# Patient Record
Sex: Male | Born: 1960
Health system: Southern US, Community
[De-identification: ages and names within clinical notes are randomized; demographics above are authoritative.]

## PROBLEM LIST (undated history)

## (undated) DIAGNOSIS — E079 Disorder of thyroid, unspecified: Secondary | ICD-10-CM

## (undated) DIAGNOSIS — J45909 Unspecified asthma, uncomplicated: Secondary | ICD-10-CM

## (undated) DIAGNOSIS — E039 Hypothyroidism, unspecified: Secondary | ICD-10-CM

## (undated) DIAGNOSIS — F32A Depression, unspecified: Secondary | ICD-10-CM

## (undated) DIAGNOSIS — I10 Essential (primary) hypertension: Secondary | ICD-10-CM

## (undated) DIAGNOSIS — F329 Major depressive disorder, single episode, unspecified: Secondary | ICD-10-CM

---

## 2013-11-13 ENCOUNTER — Emergency Department (INDEPENDENT_AMBULATORY_CARE_PROVIDER_SITE_OTHER)
Admission: EM | Admit: 2013-11-13 | Discharge: 2013-11-13 | Disposition: A | Discharge status: Home / Self Care | Payer: Self-pay | Source: Home / Self Care

## 2013-11-13 ENCOUNTER — Encounter (HOSPITAL_COMMUNITY): Payer: Self-pay | Admitting: Emergency Medicine

## 2013-11-13 DIAGNOSIS — F32A Depression, unspecified: Secondary | ICD-10-CM

## 2013-11-13 DIAGNOSIS — J069 Acute upper respiratory infection, unspecified: Secondary | ICD-10-CM

## 2013-11-13 DIAGNOSIS — F3289 Other specified depressive episodes: Secondary | ICD-10-CM

## 2013-11-13 DIAGNOSIS — F329 Major depressive disorder, single episode, unspecified: Secondary | ICD-10-CM

## 2013-11-13 DIAGNOSIS — E039 Hypothyroidism, unspecified: Secondary | ICD-10-CM

## 2013-11-13 HISTORY — DX: Disorder of thyroid, unspecified: E07.9

## 2013-11-13 HISTORY — DX: Essential (primary) hypertension: I10

## 2013-11-13 HISTORY — DX: Unspecified asthma, uncomplicated: J45.909

## 2013-11-13 NOTE — ED Provider Notes (Signed)
CSN: [id number]     Arrival date & time 11/13/13  [id number] History   First MD Initiated Contact with Patient 11/13/13 [phone]     Chief Complaint  Patient presents with  . Medication Refill   (Consider location/radiation/quality/duration/timing/severity/associated sxs/prior Treatment) HPI Comments: 53 year old male resident of  [facility] presents with complaints of URI. He is complaining of runny nose, nasal stuffiness, cough, sneezing and sore throat. He denies known fever.  [place] also [place]. He brings with him several prescriptions relating to chronic conditions. He is requesting that they be filled.   Past Medical History  Diagnosis Date  . Thyroid disease   . Hypertension   . Asthma    History reviewed. No pertinent past surgical history. History reviewed. No pertinent family history. History  Substance Use Topics  . Smoking status: Former Games developermoker  . Smokeless tobacco: Not on file  . Alcohol Use: Not on file    Review of Systems  Constitutional: Positive for activity change and fatigue. Negative for fever and diaphoresis.  HENT: Positive for postnasal drip, sneezing and sore throat. Negative for ear pain, facial swelling, rhinorrhea and trouble swallowing.   Eyes: Negative for pain, discharge and redness.  Respiratory: Positive for cough. Negative for chest tightness and shortness of breath.   Cardiovascular: Negative.   Gastrointestinal: Negative.   Musculoskeletal: Negative.  Negative for neck pain and neck stiffness.  Neurological: Negative.     Allergies  Review of patient's allergies indicates no known allergies.  Home Medications   Current Outpatient Rx  Name  Route  Sig  Dispense  Refill  . Albuterol (VENTOLIN IN)   Inhalation   Inhale into the lungs.         Marland Kitchen. amLODipine (NORVASC) 10 MG tablet   Oral   Take 10 mg by mouth daily.         . Levothyroxine Sodium (SYNTHROID PO)   Oral   Take by mouth.         . sertraline  (ZOLOFT) 100 MG tablet   Oral   Take 100 mg by mouth daily.         . TraZODone HCl (DESYREL PO)   Oral   Take by mouth.          BP 157/97  Pulse 64  Temp(Src) 98.7 F (37.1 C) (Oral)  Resp 16  SpO2 99% Physical Exam  Nursing note and vitals reviewed. Constitutional: He is oriented to person, place, and time. He appears well-developed and well-nourished. No distress.  HENT:  Mouth/Throat: Oropharynx is clear and moist. No oropharyngeal exudate.  Bilateral TMs are normal Oropharynx pink no exudates.  Eyes: Conjunctivae and EOM are normal.  Neck: Normal range of motion. Neck supple.  Cardiovascular: Normal rate, regular rhythm and normal heart sounds.   Pulmonary/Chest: Effort normal and breath sounds normal. No respiratory distress. He has no wheezes. He has no rales.  Musculoskeletal: Normal range of motion. He exhibits no edema.  Lymphadenopathy:    He has no cervical adenopathy.  Neurological: He is alert and oriented to person, place, and time.  Skin: Skin is warm and dry. No rash noted.  Psychiatric: He has a normal mood and affect.    ED Course  Procedures (including critical care time) Labs Review Labs Reviewed - No data to display Imaging Review No results found.    MDM   1. URI (upper respiratory infection)   2. Hypothyroidism   3. Depression  For cold symptoms recommend Alka-Seltzer cold plus nighttime reliever and Robitussin-DM. Drink plenty of fluids and stay well hydrated Tylenol and/or ibuprofen as directed for discomfort Pharmacists at [facility] these Rx's should be able to be filled in [place], recommend take them to pharmacist of your choice today.      Hayden Rasmussen, NP 11/13/13 0930  Hayden Rasmussen, NP 11/13/13 [phone]

## 2013-11-13 NOTE — Discharge Instructions (Signed)
Upper Respiratory Infection, Adult °An upper respiratory infection (URI) is also sometimes known as the common cold. The upper respiratory tract includes the nose, sinuses, throat, trachea, and bronchi. Bronchi are the airways leading to the lungs. Most people improve within 1 week, but symptoms can last up to 2 weeks. A residual cough may last even longer.  °CAUSES °Many different viruses can infect the tissues lining the upper respiratory tract. The tissues become irritated and inflamed and often become very moist. Mucus production is also common. A cold is contagious. You can easily spread the virus to others by oral contact. This includes kissing, sharing a glass, coughing, or sneezing. Touching your mouth or nose and then touching a surface, which is then touched by another person, can also spread the virus. °SYMPTOMS  °Symptoms typically develop 1 to 3 days after you come in contact with a cold virus. Symptoms vary from person to person. They may include: °· Runny nose. °· Sneezing. °· Nasal congestion. °· Sinus irritation. °· Sore throat. °· Loss of voice (laryngitis). °· Cough. °· Fatigue. °· Muscle aches. °· Loss of appetite. °· Headache. °· Low-grade fever. °DIAGNOSIS  °You might diagnose your own cold based on familiar symptoms, since most people get a cold 2 to 3 times a year. Your caregiver can confirm this based on your exam. Most importantly, your caregiver can check that your symptoms are not due to another disease such as strep throat, sinusitis, pneumonia, asthma, or epiglottitis. Blood tests, throat tests, and X-rays are not necessary to diagnose a common cold, but they may sometimes be helpful in excluding other more serious diseases. Your caregiver will decide if any further tests are required. °RISKS AND COMPLICATIONS  °You may be at risk for a more severe case of the common cold if you smoke cigarettes, have chronic heart disease (such as heart failure) or lung disease (such as asthma), or if  you have a weakened immune system. The very young and very old are also at risk for more serious infections. Bacterial sinusitis, middle ear infections, and bacterial pneumonia can complicate the common cold. The common cold can worsen asthma and chronic obstructive pulmonary disease (COPD). Sometimes, these complications can require emergency medical care and may be life-threatening. °PREVENTION  °The best way to protect against getting a cold is to practice good hygiene. Avoid oral or hand contact with people with cold symptoms. Wash your hands often if contact occurs. There is no clear evidence that vitamin C, vitamin E, echinacea, or exercise reduces the chance of developing a cold. However, it is always recommended to get plenty of rest and practice good nutrition. °TREATMENT  °Treatment is directed at relieving symptoms. There is no cure. Antibiotics are not effective, because the infection is caused by a virus, not by bacteria. Treatment may include: °· Increased fluid intake. Sports drinks offer valuable electrolytes, sugars, and fluids. °· Breathing heated mist or steam (vaporizer or shower). °· Eating chicken soup or other clear broths, and maintaining good nutrition. °· Getting plenty of rest. °· Using gargles or lozenges for comfort. °· Controlling fevers with ibuprofen or acetaminophen as directed by your caregiver. °· Increasing usage of your inhaler if you have asthma. °Zinc gel and zinc lozenges, taken in the first 24 hours of the common cold, can shorten the duration and lessen the severity of symptoms. Pain medicines may help with fever, muscle aches, and throat pain. A variety of non-prescription medicines are available to treat congestion and runny nose. Your caregiver   can make recommendations and may suggest nasal or lung inhalers for other symptoms.  HOME CARE INSTRUCTIONS   Only take over-the-counter or prescription medicines for pain, discomfort, or fever as directed by your  caregiver.  Use a warm mist humidifier or inhale steam from a shower to increase air moisture. This may keep secretions moist and make it easier to breathe.  Drink enough water and fluids to keep your urine clear or pale yellow.  Rest as needed.  Return to work when your temperature has returned to normal or as your caregiver advises. You may need to stay home longer to avoid infecting others. You can also use a face mask and careful hand washing to prevent spread of the virus. SEEK MEDICAL CARE IF:   After the first few days, you feel you are getting worse rather than better.  You need your caregiver's advice about medicines to control symptoms.  You develop chills, worsening shortness of breath, or brown or red sputum. These may be signs of pneumonia.  You develop yellow or brown nasal discharge or pain in the face, especially when you bend forward. These may be signs of sinusitis.  You develop a fever, swollen neck glands, pain with swallowing, or white areas in the back of your throat. These may be signs of strep throat. SEEK IMMEDIATE MEDICAL CARE IF:   You have a fever.  You develop severe or persistent headache, ear pain, sinus pain, or chest pain.  You develop wheezing, a prolonged cough, cough up blood, or have a change in your usual mucus (if you have chronic lung disease).  You develop sore muscles or a stiff neck. Document Released: 03/24/2001 Document Revised: 12/21/2011 Document Reviewed: 01/30/2011 [facility] Patient Information 2014 [place], [place].  Depression, Adult Depression is feeling sad, low, down in the dumps, blue, gloomy, or empty. In general, there are two kinds of depression:  Normal sadness or grief. This can happen after something upsetting. It often goes away on its own within 2 weeks. After losing a loved one (bereavement), normal sadness and grief may last longer than two weeks. It usually gets better with time.  Clinical depression. This kind lasts  longer than normal sadness or grief. It keeps you from doing the things you normally do in life. It is often hard to function at home, work, or at school. It may affect your relationships with others. Treatment is often needed. GET HELP RIGHT AWAY IF:  You have thoughts about hurting yourself or others.  You lose touch with reality (psychotic symptoms). You may:  See or hear things that are not real.  Have untrue beliefs about your life or people around you.  Your medicine is giving you problems. MAKE SURE YOU:  Understand these instructions.  Will watch your condition.  Will get help right away if you are not doing well or get worse. Document Released: 10/31/2010 Document Revised: 06/22/2012 Document Reviewed: 01/28/2012 [facility] Patient Information 2014 [place], [place].  Hypothyroidism The thyroid is a large gland located in the lower front of your neck. The thyroid gland helps control metabolism. Metabolism is how your body handles food. It controls metabolism with the hormone thyroxine. When this gland is underactive (hypothyroid), it produces too little hormone.  CAUSES These include:   Absence or destruction of thyroid tissue.  Goiter due to iodine deficiency.  Goiter due to medications.  Congenital defects (since birth).  Problems with the pituitary. This causes a lack of TSH (thyroid stimulating hormone). This hormone tells the  thyroid to turn out more hormone. SYMPTOMS  Lethargy (feeling as though you have no energy)  Cold intolerance  Weight gain (in spite of normal food intake)  Dry skin  Coarse hair  Menstrual irregularity (if severe, may lead to infertility)  Slowing of thought processes Cardiac problems are also caused by insufficient amounts of thyroid hormone. Hypothyroidism in the newborn is cretinism, and is an extreme form. It is important that this form be treated adequately and immediately or it will lead rapidly to retarded physical and mental  development. DIAGNOSIS  To prove hypothyroidism, your caregiver may do blood tests and ultrasound tests. Sometimes the signs are hidden. It may be necessary for your caregiver to watch this illness with blood tests either before or after diagnosis and treatment. TREATMENT  Low levels of thyroid hormone are increased by using synthetic thyroid hormone. This is a safe, effective treatment. It usually takes about four weeks to gain the full effects of the medication. After you have the full effect of the medication, it will generally take another four weeks for problems to leave. Your caregiver may start you on low doses. If you have had heart problems the dose may be gradually increased. It is generally not an emergency to get rapidly to normal. HOME CARE INSTRUCTIONS   Take your medications as your caregiver suggests. Let your caregiver know of any medications you are taking or start taking. Your caregiver will help you with dosage schedules.  As your condition improves, your dosage needs may increase. It will be necessary to have continuing blood tests as suggested by your caregiver.  Report all suspected medication side effects to your caregiver. SEEK MEDICAL CARE IF: Seek medical care if you develop:  Sweating.  Tremulousness (tremors).  Anxiety.  Rapid weight loss.  Heat intolerance.  Emotional swings.  Diarrhea.  Weakness. SEEK IMMEDIATE MEDICAL CARE IF:  You develop chest pain, an irregular heart beat (palpitations), or a rapid heart beat. MAKE SURE YOU:   Understand these instructions.  Will watch your condition.  Will get help right away if you are not doing well or get worse. Document Released: 09/28/2005 Document Revised: 12/21/2011 Document Reviewed: 05/18/2008 [facility] Patient Information 2014 [place], [place].

## 2013-11-13 NOTE — ED Notes (Signed)
Pt  Has  rx  With  Him  From  [place]  For  His  maintance meds      They appear  Legitimate   [facility]  [facility]   And    She  Stated  They  Should  Be  Valid  At  A   [facility]

## 2013-11-13 NOTE — ED Notes (Signed)
Pt  [facility]  [facility]   He  Is  Here  [place] [facility]   Symptoms  Of a  [place] PeersUri  As  Well  As  To  Get  His  meds  Refilled  He  Is  In no  Distress

## 2013-11-16 NOTE — ED Provider Notes (Signed)
Medical screening examination/treatment/procedure(s) were performed by a resident physician or non-physician practitioner and as the supervising physician I was immediately available for consultation/collaboration.  Evan Corey, MD    Evan S Corey, MD 11/16/13 0745 

## 2014-02-09 ENCOUNTER — Emergency Department (INDEPENDENT_AMBULATORY_CARE_PROVIDER_SITE_OTHER): Payer: Self-pay

## 2014-02-09 ENCOUNTER — Emergency Department (INDEPENDENT_AMBULATORY_CARE_PROVIDER_SITE_OTHER)
Admission: EM | Admit: 2014-02-09 | Discharge: 2014-02-09 | Disposition: A | Discharge status: Home / Self Care | Payer: Self-pay | Source: Home / Self Care | Attending: Family Medicine | Admitting: Family Medicine

## 2014-02-09 ENCOUNTER — Encounter (HOSPITAL_COMMUNITY): Payer: Self-pay | Admitting: Emergency Medicine

## 2014-02-09 DIAGNOSIS — S92911A Unspecified fracture of right toe(s), initial encounter for closed fracture: Secondary | ICD-10-CM

## 2014-02-09 DIAGNOSIS — S92919A Unspecified fracture of unspecified toe(s), initial encounter for closed fracture: Secondary | ICD-10-CM

## 2014-02-09 DIAGNOSIS — I10 Essential (primary) hypertension: Secondary | ICD-10-CM

## 2014-02-09 DIAGNOSIS — IMO0002 Reserved for concepts with insufficient information to code with codable children: Secondary | ICD-10-CM

## 2014-02-09 LAB — POCT I-STAT, CHEM 8
BUN: 9 mg/dL (ref 6–23)
CALCIUM ION: 1.21 mmol/L (ref 1.12–1.23)
CHLORIDE: 104 meq/L (ref 96–112)
CREATININE: 1.1 mg/dL (ref 0.50–1.35)
GLUCOSE: 90 mg/dL (ref 70–99)
HCT: 48 % (ref 39.0–52.0)
Hemoglobin: 16.3 g/dL (ref 13.0–17.0)
Potassium: 3.5 mEq/L — ABNORMAL LOW (ref 3.7–5.3)
Sodium: 145 mEq/L (ref 137–147)
TCO2: 25 mmol/L (ref 0–100)

## 2014-02-09 MED ORDER — AMLODIPINE BESYLATE 10 MG PO TABS: 10.0000 mg | ORAL_TABLET | Freq: Every day | ORAL | Status: DC

## 2014-02-09 MED ORDER — LISINOPRIL 20 MG PO TABS: 20.0000 mg | ORAL_TABLET | Freq: Every day | ORAL | Status: DC

## 2014-02-09 NOTE — ED Provider Notes (Signed)
Herma Meringommy Brodersen is a 53 y.o. male who presents to Urgent Care today for right toe. Patient notes right toe pain. The pain is located in the right great toe. He dropped a heavy object onto it about a week ago. The pain persists and is located in the proximal phalanx. Pain is worse with activity better with rest. No medications yet. Additionally he notes he stopped taking his blood pressure medication over the past several months. No chest pains palpitations or shortness of breath.   Past Medical History  Diagnosis Date  . Thyroid disease   . Hypertension   . Asthma    History  Substance Use Topics  . Smoking status: Never Smoker   . Smokeless tobacco: Not on file  . Alcohol Use: Not on file   ROS as above Medications: No current facility-administered medications for this encounter.   Current Outpatient Prescriptions  Medication Sig Dispense Refill  . Albuterol (VENTOLIN IN) Inhale into the lungs.      Marland Kitchen. amLODipine (NORVASC) 10 MG tablet Take 1 tablet (10 mg total) by mouth daily.  30 tablet  0  . Levothyroxine Sodium (SYNTHROID PO) Take by mouth.      Marland Kitchen. lisinopril (PRINIVIL,ZESTRIL) 20 MG tablet Take 1 tablet (20 mg total) by mouth daily.  30 tablet  0  . sertraline (ZOLOFT) 100 MG tablet Take 100 mg by mouth daily.      . TraZODone HCl (DESYREL PO) Take by mouth.        Exam:  BP 180/102  Pulse 56  Temp(Src) 98.2 F (36.8 C) (Oral)  Resp 18  SpO2 100% Gen: Well NAD HEENT: EOMI,  MMM Lungs: Normal work of breathing. CTABL Heart: RRR no MRG Abd: NABS, Soft. NT, ND Exts: Brisk capillary refill, warm and well perfused.  Right great toe: Swollen and tender at the distal phalanx. Capillary refill and sensation intact  Results for orders placed during the hospital encounter of 02/09/14 (from the past 24 hour(s))  POCT I-STAT, CHEM 8     Status: Abnormal   Collection Time    02/09/14  1:16 PM      Result Value Ref Range   Sodium 145  137 - 147 mEq/L   Potassium 3.5 (*) 3.7 -  5.3 mEq/L   Chloride 104  96 - 112 mEq/L   BUN 9  6 - 23 mg/dL   Creatinine, Ser 9.601.10  0.50 - 1.35 mg/dL   Glucose, Bld 90  70 - 99 mg/dL   Calcium, Ion 4.541.21  0.981.12 - 1.23 mmol/L   TCO2 25  0 - 100 mmol/L   Hemoglobin 16.3  13.0 - 17.0 g/dL   HCT 11.948.0  14.739.0 - 82.952.0 %   Dg Foot Complete Right  02/09/2014   CLINICAL DATA:  Injury.  EXAM: RIGHT FOOT COMPLETE - 3+ VIEW  COMPARISON:  None.  FINDINGS: Nondisplaced fracture at the base of the distal phalanx of the right great toe. Distal tuft fracture also noted of the distal phalanx. Fracture fragments of the distal tuft fracture are displaced. DJD first MTP joint. No foreign body.  IMPRESSION: 1. Fractures involving the distal phalanx of the right great toe. 2. DJD first MTP joint.   Electronically Signed   By: Maisie Fushomas  Register   On: 02/09/2014 13:35    Assessment and Plan: 53 y.o. male with right great toe fracture. Plan for postoperative shoe and watchful waiting. Additionally patient has poorly controlled hypertension. We'll prescribe of amlodipine and lisinopril. Followup with  primary care provider soon as possible.  Discussed warning signs or symptoms. Please see discharge instructions. Patient expresses understanding.    Rodolph BongEvan S Corey, MD 02/09/14 1340

## 2014-02-09 NOTE — Discharge Instructions (Signed)
Thank you for coming in today. Use the post op shoe.  Take both amlodipine and lisinopril for blood pressure.  Follow up with a regular doctor.  Toe Fracture Your caregiver has diagnosed you as having a fractured toe. A toe fracture is a break in the bone of a toe. "Buddy taping" is a way of splinting your broken toe, by taping the broken toe to the toe next to it. This "buddy taping" will keep the injured toe from moving beyond normal range of motion. Buddy taping also helps the toe heal in a more normal alignment. It may take 6 to 8 weeks for the toe injury to heal. HOME CARE INSTRUCTIONS   Leave your toes taped together for as long as directed by your caregiver or until you see a doctor for a follow-up examination. You can change the tape after bathing. Always use a small piece of gauze or cotton between the toes when taping them together. This will help the skin stay dry and prevent infection.  Apply ice to the injury for 15-20 minutes each hour while awake for the first 2 days. Put the ice in a plastic bag and place a towel between the bag of ice and your skin.  After the first 2 days, apply heat to the injured area. Use heat for the next 2 to 3 days. Place a heating pad on the foot or soak the foot in warm water as directed by your caregiver.  Keep your foot elevated as much as possible to lessen swelling.  Wear sturdy, supportive shoes. The shoes should not pinch the toes or fit tightly against the toes.  Your caregiver may prescribe a rigid shoe if your foot is very swollen.  Your may be given crutches if the pain is too great and it hurts too much to walk.  Only take over-the-counter or prescription medicines for pain, discomfort, or fever as directed by your caregiver.  If your caregiver has given you a follow-up appointment, it is very important to keep that appointment. Not keeping the appointment could result in a chronic or permanent injury, pain, and disability. If there is any  problem keeping the appointment, you must call back to this facility for assistance. SEEK MEDICAL CARE IF:   You have increased pain or swelling, not relieved with medications.  The pain does not get better after 1 week.  Your injured toe is cold when the others are warm. SEEK IMMEDIATE MEDICAL CARE IF:   The toe becomes cold, numb, or white.  The toe becomes hot (inflamed) and red. Document Released: 09/25/2000 Document Revised: 12/21/2011 Document Reviewed: 05/14/2008 [facility] Patient Information 2014 [place], [place].   Arterial Hypertension Arterial hypertension (high blood pressure) is a condition of elevated pressure in your blood vessels. Hypertension over a long period of time is a risk factor for strokes, heart attacks, and heart failure. It is also the leading cause of kidney (renal) failure.  CAUSES   In Adults -- Over 90% of all hypertension has no known cause. This is called essential or primary hypertension. In the other 10% of people with hypertension, the increase in blood pressure is caused by another disorder. This is called secondary hypertension. Important causes of secondary hypertension are:  Heavy alcohol use.  Obstructive sleep apnea.  Hyperaldosterosim (Conn's syndrome).  Steroid use.  Chronic kidney failure.  Hyperparathyroidism.  Medications.  Renal artery stenosis.  Pheochromocytoma.  Cushing's disease.  Coarctation of the aorta.  Scleroderma renal crisis.  Licorice (in excessive  amounts).  Drugs (cocaine, methamphetamine). Your caregiver can explain any items above that apply to you.  In Children -- Secondary hypertension is more common and should always be considered.  Pregnancy -- Few women of childbearing age have high blood pressure. However, up to 10% of them develop hypertension of pregnancy. Generally, this will not harm the woman. It may be a sign of 3 complications of pregnancy: preeclampsia, HELLP syndrome, and eclampsia.  Follow up and control with medication is necessary. SYMPTOMS   This condition normally does not produce any noticeable symptoms. It is usually found during a routine exam.  Malignant hypertension is a late problem of high blood pressure. It may have the following symptoms:  Headaches.  Blurred vision.  End-organ damage (this means your kidneys, heart, lungs, and other organs are being damaged).  Stressful situations can increase the blood pressure. If a person with normal blood pressure has their blood pressure go up while being seen by their caregiver, this is often termed "white coat hypertension." Its importance is not known. It may be related with eventually developing hypertension or complications of hypertension.  Hypertension is often confused with mental tension, stress, and anxiety. DIAGNOSIS  The diagnosis is made by 3 separate blood pressure measurements. They are taken at least 1 week apart from each other. If there is organ damage from hypertension, the diagnosis may be made without repeat measurements. Hypertension is usually identified by having blood pressure readings:  Above 140/90 mmHg measured in both arms, at 3 separate times, over a couple weeks.  Over 130/80 mmHg should be considered a risk factor and may require treatment in patients with diabetes. Blood pressure readings over 120/80 mmHg are called "pre-hypertension" even in non-diabetic patients. To get a true blood pressure measurement, use the following guidelines. Be aware of the factors that can alter blood pressure readings.  Take measurements at least 1 hour after caffeine.  Take measurements 30 minutes after smoking and without any stress. This is another reason to quit smoking  it raises your blood pressure.  Use a proper cuff size. Ask your caregiver if you are not sure about your cuff size.  Most home blood pressure cuffs are automatic. They will measure systolic and diastolic pressures. The systolic  pressure is the pressure reading at the start of sounds. Diastolic pressure is the pressure at which the sounds disappear. If you are elderly, measure pressures in multiple postures. Try sitting, lying or standing.  Sit at rest for a minimum of 5 minutes before taking measurements.  You should not be on any medications like decongestants. These are found in many cold medications.  Record your blood pressure readings and review them with your caregiver. If you have hypertension:  Your caregiver may do tests to be sure you do not have secondary hypertension (see "causes" above).  Your caregiver may also look for signs of metabolic syndrome. This is also called Syndrome X or Insulin Resistance Syndrome. You may have this syndrome if you have type 2 diabetes, abdominal obesity, and abnormal blood lipids in addition to hypertension.  Your caregiver will take your medical and family history and perform a physical exam.  Diagnostic tests may include blood tests (for glucose, cholesterol, potassium, and kidney function), a urinalysis, or an EKG. Other tests may also be necessary depending on your condition. PREVENTION  There are important lifestyle issues that you can adopt to reduce your chance of developing hypertension:  Maintain a normal weight.  Limit the amount of salt (  sodium) in your diet.  Exercise often.  Limit alcohol intake.  Get enough potassium in your diet. Discuss specific advice with your caregiver.  Follow a DASH diet (dietary approaches to stop hypertension). This diet is rich in fruits, vegetables, and low-fat dairy products, and avoids certain fats. PROGNOSIS  Essential hypertension cannot be cured. Lifestyle changes and medical treatment can lower blood pressure and reduce complications. The prognosis of secondary hypertension depends on the underlying cause. Many people whose hypertension is controlled with medicine or lifestyle changes can live a normal, healthy life.    RISKS AND COMPLICATIONS  While high blood pressure alone is not an illness, it often requires treatment due to its short- and long-term effects on many organs. Hypertension increases your risk for:  CVAs or strokes (cerebrovascular accident).  Heart failure due to chronically high blood pressure (hypertensive cardiomyopathy).  Heart attack (myocardial infarction).  Damage to the retina (hypertensive retinopathy).  Kidney failure (hypertensive nephropathy). Your caregiver can explain list items above that apply to you. Treatment of hypertension can significantly reduce the risk of complications. TREATMENT   For overweight patients, weight loss and regular exercise are recommended. Physical fitness lowers blood pressure.  Mild hypertension is usually treated with diet and exercise. A diet rich in fruits and vegetables, fat-free dairy products, and foods low in fat and salt (sodium) can help lower blood pressure. Decreasing salt intake decreases blood pressure in a 1/3 of people.  Stop smoking if you are a smoker. The steps above are highly effective in reducing blood pressure. While these actions are easy to suggest, they are difficult to achieve. Most patients with moderate or severe hypertension end up requiring medications to bring their blood pressure down to a normal level. There are several classes of medications for treatment. Blood pressure pills (antihypertensives) will lower blood pressure by their different actions. Lowering the blood pressure by 10 mmHg may decrease the risk of complications by as much as 25%. The goal of treatment is effective blood pressure control. This will reduce your risk for complications. Your caregiver will help you determine the best treatment for you according to your lifestyle. What is excellent treatment for one person, may not be for you. HOME CARE INSTRUCTIONS   Do not smoke.  Follow the lifestyle changes outlined in the "Prevention"  section.  If you are on medications, follow the directions carefully. Blood pressure medications must be taken as prescribed. Skipping doses reduces their benefit. It also puts you at risk for problems.  Follow up with your caregiver, as directed.  If you are asked to monitor your blood pressure at home, follow the guidelines in the "Diagnosis" section above. SEEK MEDICAL CARE IF:   You think you are having medication side effects.  You have recurrent headaches or lightheadedness.  You have swelling in your ankles.  You have trouble with your vision. SEEK IMMEDIATE MEDICAL CARE IF:   You have sudden onset of chest pain or pressure, difficulty breathing, or other symptoms of a heart attack.  You have a severe headache.  You have symptoms of a stroke (such as sudden weakness, difficulty speaking, difficulty walking). MAKE SURE YOU:   Understand these instructions.  Will watch your condition.  Will get help right away if you are not doing well or get worse. Document Released: 09/28/2005 Document Revised: 12/21/2011 Document Reviewed: 04/28/2007 [facility] Patient Information 2014 [place].

## 2014-02-09 NOTE — ED Notes (Signed)
Pt reports inj to right greater toe onset 2 weeks Reports he dropped a flower pot on toe  Sx include swelling and pain Ambulated well to exam room w/NAD Alert w/no signs of acute distress.

## 2014-03-24 ENCOUNTER — Emergency Department (INDEPENDENT_AMBULATORY_CARE_PROVIDER_SITE_OTHER)
Admission: EM | Admit: 2014-03-24 | Discharge: 2014-03-24 | Disposition: A | Discharge status: Home / Self Care | Payer: Self-pay | Source: Home / Self Care | Attending: Emergency Medicine | Admitting: Emergency Medicine

## 2014-03-24 ENCOUNTER — Encounter (HOSPITAL_COMMUNITY): Payer: Self-pay | Admitting: Emergency Medicine

## 2014-03-24 DIAGNOSIS — I10 Essential (primary) hypertension: Secondary | ICD-10-CM

## 2014-03-24 MED ORDER — LISINOPRIL 20 MG PO TABS: 20.0000 mg | ORAL_TABLET | Freq: Every day | ORAL | Status: DC

## 2014-03-24 MED ORDER — AMLODIPINE BESYLATE 10 MG PO TABS: 10.0000 mg | ORAL_TABLET | Freq: Every day | ORAL | Status: DC

## 2014-03-24 NOTE — Discharge Instructions (Signed)

## 2014-03-24 NOTE — ED Provider Notes (Signed)
CSN: [id number]     Arrival date & time 03/24/14  1849 History   First MD Initiated Contact with Patient 03/24/14 1920     Chief Complaint  Patient presents with  . Medication Refill   (Consider location/radiation/quality/duration/timing/severity/associated sxs/prior Treatment) HPI Comments: Patient presents requesting refills of his medications and also requesting to have his toenails cut. He ran out of his high blood pressure medicine today. Denies chest pain, shortness of breath, leg swelling. Also he says his toenails are long and he doesn't own a toenail clipper and would like me to cut them. He is not diabetic   Past Medical History  Diagnosis Date  . Thyroid disease   . Hypertension   . Asthma    History reviewed. No pertinent past surgical history. No family history on file. History  Substance Use Topics  . Smoking status: Never Smoker   . Smokeless tobacco: Not on file  . Alcohol Use: No    Review of Systems  Constitutional: Negative for fever, chills and fatigue.  HENT: Negative for sore throat.   Eyes: Negative for visual disturbance.  Respiratory: Negative for cough and shortness of breath.   Cardiovascular: Negative for chest pain, palpitations and leg swelling.  Gastrointestinal: Negative for nausea, vomiting, abdominal pain, diarrhea and constipation.  Genitourinary: Negative for dysuria, urgency, frequency and hematuria.  Musculoskeletal: Negative for arthralgias, myalgias, neck pain and neck stiffness.  Skin: Negative for rash.       Long toenails  Neurological: Negative for dizziness, weakness and light-headedness.  All other systems reviewed and are negative.   Allergies  Review of patient's allergies indicates no known allergies.  Home Medications   Prior to Admission medications   Medication Sig Start Date End Date Taking? Authorizing Provider  Albuterol (VENTOLIN IN) Inhale into the lungs.    Historical Provider, MD  amLODipine (NORVASC) 10 MG  tablet Take 1 tablet (10 mg total) by mouth daily. 03/24/14   Graylon GoodZachary H Shelley Pooley, PA-C  Levothyroxine Sodium (SYNTHROID PO) Take by mouth.    Historical Provider, MD  lisinopril (PRINIVIL,ZESTRIL) 20 MG tablet Take 1 tablet (20 mg total) by mouth daily. 03/24/14   Graylon GoodZachary H Parvin Stetzer, PA-C  sertraline (ZOLOFT) 100 MG tablet Take 100 mg by mouth daily.    Historical Provider, MD  TraZODone HCl (DESYREL PO) Take by mouth.    Historical Provider, MD   There were no vitals taken for this visit. Physical Exam  Nursing note and vitals reviewed. Constitutional: He is oriented to person, place, and time. He appears well-developed and well-nourished. No distress.  HENT:  Head: Normocephalic.  Cardiovascular: Normal rate, regular rhythm, normal heart sounds and intact distal pulses.  Exam reveals no gallop and no friction rub.   No murmur heard. Pulmonary/Chest: Effort normal and breath sounds normal. No respiratory distress. He has no wheezes. He has no rales.  Neurological: He is alert and oriented to person, place, and time. Coordination normal.  Skin: Skin is warm and dry. No rash noted. He is not diaphoretic.  Psychiatric: He has a normal mood and affect. Judgment normal.    ED Course  Procedures (including critical care time) Labs Review Labs Reviewed - No data to display  Imaging Review No results found.   MDM   1. Hypertension    Blood pressure medications refilled. He will go purchase a toenail clipper, and will followup with primary care.   Meds ordered this encounter  Medications  . lisinopril (PRINIVIL,ZESTRIL) 20 MG tablet  Sig: Take 1 tablet (20 mg total) by mouth daily.    Dispense:  30 tablet    Refill:  0    Order Specific Question:  Supervising Provider    Answer:  Lorenz CoasterKELLER, DAVID C [id number]]  . amLODipine (NORVASC) 10 MG tablet    Sig: Take 1 tablet (10 mg total) by mouth daily.    Dispense:  30 tablet    Refill:  0    Order Specific Question:  Supervising Provider     Answer:  Lorenz CoasterKELLER, DAVID C [[id number]]       Graylon GoodZachary H Sanayah Munro, PA-C 03/24/14 2021

## 2014-03-24 NOTE — ED Provider Notes (Signed)
Medical screening examination/treatment/procedure(s) were performed by non-physician practitioner and as supervising physician I was immediately available for consultation/collaboration.  Leslee Homeavid Hiilei Gerst, M.D.  Reuben Likesavid C Payson Crumby, MD 03/24/14 [phone]

## 2014-03-24 NOTE — ED Notes (Signed)
Pt is here needing refills on HTN meds Also needing toe nails clipped???  [facility] not own a toe nail clipper[facility] Alert w/no signs of acute distress.

## 2014-08-06 ENCOUNTER — Emergency Department (INDEPENDENT_AMBULATORY_CARE_PROVIDER_SITE_OTHER)
Admission: EM | Admit: 2014-08-06 | Discharge: 2014-08-06 | Disposition: A | Discharge status: Home / Self Care | Payer: Self-pay | Source: Home / Self Care

## 2014-08-06 ENCOUNTER — Encounter (HOSPITAL_COMMUNITY): Payer: Self-pay | Admitting: Emergency Medicine

## 2014-08-06 DIAGNOSIS — I10 Essential (primary) hypertension: Secondary | ICD-10-CM

## 2014-08-06 MED ORDER — LISINOPRIL 20 MG PO TABS: 20.0000 mg | ORAL_TABLET | Freq: Every day | ORAL | Status: DC

## 2014-08-06 MED ORDER — AMLODIPINE BESYLATE 10 MG PO TABS: 10.0000 mg | ORAL_TABLET | Freq: Every day | ORAL | Status: DC

## 2014-08-06 NOTE — ED Notes (Signed)
Pt is here today because he has a history of increased BP, pt said that he has had high BP for years and ran out of his meds about 1 month ago, pt started to experience head aches and came in today for BP medication

## 2014-08-06 NOTE — ED Provider Notes (Signed)
CSN: [id number]     Arrival date & time 08/06/14  1746 History   First MD Initiated Contact with Patient 08/06/14 1850     Chief Complaint  Patient presents with  . Hypertension   (Consider location/radiation/quality/duration/timing/severity/associated sxs/prior Treatment) HPI Comments: 53 year old male resident [facility] presents to the urgent care for refill of his antihypertensives. He has obtained his antihypertensives from the urgent care in the past. He has been instructed to follow-up with the PCP however he has not, possibly due to inadequate resources. He is complaining of headache and occasional dizziness since stopping his medications approximately 3 weeks ago.   Past Medical History  Diagnosis Date  . Thyroid disease   . Hypertension   . Asthma    History reviewed. No pertinent past surgical history. History reviewed. No pertinent family history. History  Substance Use Topics  . Smoking status: Never Smoker   . Smokeless tobacco: Not on file  . Alcohol Use: No    Review of Systems  Constitutional: Negative.   Respiratory: Negative for cough and shortness of breath.   Cardiovascular: Negative for chest pain, palpitations and leg swelling.  Gastrointestinal: Negative for nausea, vomiting and abdominal pain.  Skin: Negative.   Neurological: Positive for dizziness and headaches. Negative for tremors, seizures, syncope and speech difficulty.  Psychiatric/Behavioral: Negative.     Allergies  Review of patient's allergies indicates no known allergies.  Home Medications   Prior to Admission medications   Medication Sig Start Date End Date Taking? Authorizing Provider  Albuterol (VENTOLIN IN) Inhale into the lungs.    Historical Provider, MD  amLODipine (NORVASC) 10 MG tablet Take 1 tablet (10 mg total) by mouth daily. 03/24/14   Adrian BlackwaterZachary H Baker, PA-C  amLODipine (NORVASC) 10 MG tablet Take 1 tablet (10 mg total) by mouth daily. 08/06/14   Hayden Rasmussenavid Jarriel Papillion, NP   Levothyroxine Sodium (SYNTHROID PO) Take by mouth.    Historical Provider, MD  lisinopril (PRINIVIL,ZESTRIL) 20 MG tablet Take 1 tablet (20 mg total) by mouth daily. 03/24/14   Adrian BlackwaterZachary H Baker, PA-C  lisinopril (PRINIVIL,ZESTRIL) 20 MG tablet Take 1 tablet (20 mg total) by mouth daily. 08/06/14   Hayden Rasmussenavid Ozias Dicenzo, NP  sertraline (ZOLOFT) 100 MG tablet Take 100 mg by mouth daily.    Historical Provider, MD  TraZODone HCl (DESYREL PO) Take by mouth.    Historical Provider, MD   BP 171/103  Pulse 66  Temp(Src) 98.3 F (36.8 C) (Oral)  Resp 16  SpO2 98% Physical Exam  Nursing note and vitals reviewed. Constitutional: He is oriented to person, place, and time. He appears well-developed and well-nourished. No distress.  Eyes: Conjunctivae and EOM are normal.  Neck: Normal range of motion. Neck supple.  Cardiovascular: Normal rate, regular rhythm and normal heart sounds.   Pulmonary/Chest: Effort normal and breath sounds normal. No respiratory distress. He has no wheezes. He has no rales.  Musculoskeletal: Normal range of motion. He exhibits no edema and no tenderness.  Neurological: He is alert and oriented to person, place, and time. He exhibits normal muscle tone.  Skin: Skin is warm and dry. No rash noted.  Psychiatric: He has a normal mood and affect.    ED Course  Procedures (including critical care time) Labs Review Labs Reviewed - No data to display  Imaging Review No results found.   MDM   1. Essential hypertension     Refill amlodipine 10 mg 1 daily #30 and lisinopril 20 mg 1 daily #30. It  was reinforced that the patient needs to have adequate management and evaluation for his hypertensive. Prescribing medications without upper evaluation could lead to adverse effects of the medication as well as failure to produce an adequate blood pressure response. Patient states he understands. He will be given the telephone number of the operator as well as the community wellness program.  He is also advised that he might return to the urgent care during the day [facility] an orange card.    Hayden Rasmussenavid Shireen Rayburn, NP 08/06/14 1907

## 2014-08-06 NOTE — Discharge Instructions (Signed)
Hypertension Hypertension is another name for high blood pressure. High blood pressure forces your heart to work harder to pump blood. A blood pressure reading has two numbers, which includes a higher number over a lower number (example: 110/72). HOME CARE   Have your blood pressure rechecked by your doctor.  Only take medicine as told by your doctor. Follow the directions carefully. The medicine does not work as well if you skip doses. Skipping doses also puts you at risk for problems.  Do not smoke.  Monitor your blood pressure at home as told by your doctor. GET HELP IF:  You think you are having a reaction to the medicine you are taking.  You have repeat headaches or feel dizzy.  You have puffiness (swelling) in your ankles.  You have trouble with your vision. GET HELP RIGHT AWAY IF:   You get a very bad headache and are confused.  You feel weak, numb, or faint.  You get chest or belly (abdominal) pain.  You throw up (vomit).  You cannot breathe very well. MAKE SURE YOU:   Understand these instructions.  Will watch your condition.  Will get help right away if you are not doing well or get worse. Document Released: 03/16/2008 Document Revised: 10/03/2013 Document Reviewed: 07/21/2013 [employer] Patient Information 2015 [employer]. This information is not intended to replace advice given to you by your health care provider. Make sure you discuss any questions you have with your health care provider.  

## 2014-08-07 NOTE — ED Provider Notes (Signed)
Medical screening examination/treatment/procedure(s) were performed by resident physician or non-physician practitioner and as supervising physician I was immediately available for consultation/collaboration.   Sumner Kirchman DOUGLAS MD.   Donnis Pecha D Bon Dowis, MD 08/07/14 1013 

## 2014-10-16 ENCOUNTER — Emergency Department (HOSPITAL_COMMUNITY)
Admission: EM | Admit: 2014-10-16 | Discharge: 2014-10-16 | Disposition: A | Discharge status: Home / Self Care | Payer: Self-pay | Attending: Emergency Medicine | Admitting: Emergency Medicine

## 2014-10-16 ENCOUNTER — Emergency Department (HOSPITAL_COMMUNITY): Payer: Self-pay

## 2014-10-16 ENCOUNTER — Encounter (HOSPITAL_COMMUNITY): Payer: Self-pay | Admitting: Emergency Medicine

## 2014-10-16 DIAGNOSIS — Z79899 Other long term (current) drug therapy: Secondary | ICD-10-CM | POA: Insufficient documentation

## 2014-10-16 DIAGNOSIS — R51 Headache: Secondary | ICD-10-CM | POA: Insufficient documentation

## 2014-10-16 DIAGNOSIS — E079 Disorder of thyroid, unspecified: Secondary | ICD-10-CM | POA: Insufficient documentation

## 2014-10-16 DIAGNOSIS — I160 Hypertensive urgency: Secondary | ICD-10-CM

## 2014-10-16 DIAGNOSIS — I10 Essential (primary) hypertension: Secondary | ICD-10-CM | POA: Insufficient documentation

## 2014-10-16 DIAGNOSIS — J45909 Unspecified asthma, uncomplicated: Secondary | ICD-10-CM | POA: Insufficient documentation

## 2014-10-16 LAB — CBC WITH DIFFERENTIAL/PLATELET
Basophils Absolute: 0 10*3/uL (ref 0.0–0.1)
Basophils Relative: 0 % (ref 0–1)
EOS PCT: 2 % (ref 0–5)
Eosinophils Absolute: 0.1 10*3/uL (ref 0.0–0.7)
HCT: 38.6 % — ABNORMAL LOW (ref 39.0–52.0)
Hemoglobin: 12.7 g/dL — ABNORMAL LOW (ref 13.0–17.0)
LYMPHS PCT: 34 % (ref 12–46)
Lymphs Abs: 1.8 10*3/uL (ref 0.7–4.0)
MCH: 27.7 pg (ref 26.0–34.0)
MCHC: 32.9 g/dL (ref 30.0–36.0)
MCV: 84.1 fL (ref 78.0–100.0)
Monocytes Absolute: 0.4 10*3/uL (ref 0.1–1.0)
Monocytes Relative: 8 % (ref 3–12)
NEUTROS ABS: 2.9 10*3/uL (ref 1.7–7.7)
Neutrophils Relative %: 56 % (ref 43–77)
PLATELETS: 123 10*3/uL — AB (ref 150–400)
RBC: 4.59 MIL/uL (ref 4.22–5.81)
RDW: 15.1 % (ref 11.5–15.5)
WBC: 5.4 10*3/uL (ref 4.0–10.5)

## 2014-10-16 LAB — BASIC METABOLIC PANEL
Anion gap: 5 (ref 5–15)
BUN: 14 mg/dL (ref 6–23)
CO2: 26 mmol/L (ref 19–32)
CREATININE: 1.2 mg/dL (ref 0.50–1.35)
Calcium: 8.8 mg/dL (ref 8.4–10.5)
Chloride: 109 mEq/L (ref 96–112)
GFR, EST AFRICAN AMERICAN: 77 mL/min — AB (ref >90)
GFR, EST NON AFRICAN AMERICAN: 66 mL/min — AB (ref >90)
GLUCOSE: 81 mg/dL (ref 70–99)
Potassium: 3.8 mmol/L (ref 3.5–5.1)
Sodium: 140 mmol/L (ref 135–145)

## 2014-10-16 LAB — URINALYSIS, ROUTINE W REFLEX MICROSCOPIC
Bilirubin Urine: NEGATIVE
Glucose, UA: NEGATIVE mg/dL
Hgb urine dipstick: NEGATIVE
KETONES UR: NEGATIVE mg/dL
Leukocytes, UA: NEGATIVE
NITRITE: NEGATIVE
PH: 7 (ref 5.0–8.0)
Protein, ur: NEGATIVE mg/dL
Specific Gravity, Urine: 1.021 (ref 1.005–1.030)
Urobilinogen, UA: 1 mg/dL (ref 0.0–1.0)

## 2014-10-16 LAB — I-STAT TROPONIN, ED: Troponin i, poc: 0 ng/mL (ref 0.00–0.08)

## 2014-10-16 MED ORDER — AMLODIPINE BESYLATE 10 MG PO TABS: 10.0000 mg | ORAL_TABLET | Freq: Every day | ORAL | Status: DC

## 2014-10-16 MED ORDER — LISINOPRIL 20 MG PO TABS: 20.0000 mg | ORAL_TABLET | Freq: Every day | ORAL | Status: DC

## 2014-10-16 MED ORDER — LISINOPRIL 20 MG PO TABS
20.0000 mg | ORAL_TABLET | Freq: Once | ORAL | Status: AC
  Administered 2014-10-16: 20 mg via ORAL
  Filled 2014-10-16: qty 1

## 2014-10-16 MED ORDER — AMLODIPINE BESYLATE 5 MG PO TABS
10.0000 mg | ORAL_TABLET | Freq: Once | ORAL | Status: AC
  Administered 2014-10-16: 10 mg via ORAL
  Filled 2014-10-16: qty 2

## 2014-10-16 NOTE — ED Notes (Addendum)
Pt reports htn due to being out of amlodipine and lisinopril due to financial reasons.  Pt also reports sob x2 weeks.  Pt alert and oriented and on room air 100% 02. Pt also reports midsternal cp x3days that "comes and goes."  Pt denies n/v/d, sweating, radiation.  Pt denies cp at this time.

## 2014-10-16 NOTE — Discharge Instructions (Signed)
It is very important to take all medication as directed, and be sure to follow-up with a primary care physician for appropriate ongoing evaluation of your conditions.

## 2014-10-16 NOTE — ED Provider Notes (Signed)
CSN: [id number]     Arrival date & time 10/16/14  1804 History   First MD Initiated Contact with Patient 10/16/14 2029     Chief Complaint  Patient presents with  . Cough  . Hypertension     HPI  Patient resents with concern of ongoing hypertension, as well as new cough, mild headache. Symptoms all began approximately 2 weeks ago, when the patient ran out of medication. Patient denies sustained or substantial chest pain, pressure, or any dyspnea. He also denies any nausea, vomiting, diarrhea. The cough is mild, dry, with associated rhinorrhea.   Past Medical History  Diagnosis Date  . Thyroid disease   . Hypertension   . Asthma    History reviewed. No pertinent past surgical history. No family history on file. History  Substance Use Topics  . Smoking status: Never Smoker   . Smokeless tobacco: Not on file  . Alcohol Use: No    Review of Systems  Constitutional:       Per HPI, otherwise negative  HENT:       Per HPI, otherwise negative  Respiratory:       Per HPI, otherwise negative  Cardiovascular:       Per HPI, otherwise negative  Gastrointestinal: Negative for vomiting.  Endocrine:       Negative aside from HPI  Genitourinary:       Neg aside from HPI   Musculoskeletal:       Per HPI, otherwise negative  Skin: Negative.   Neurological: Negative for syncope.      Allergies  Review of patient's allergies indicates no known allergies.  Home Medications   Prior to Admission medications   Medication Sig Start Date End Date Taking? Authorizing Provider  Albuterol (VENTOLIN IN) Inhale into the lungs.    Historical Provider, MD  amLODipine (NORVASC) 10 MG tablet Take 1 tablet (10 mg total) by mouth daily. 03/24/14   Adrian Blackwater Baker, PA-C  amLODipine (NORVASC) 10 MG tablet Take 1 tablet (10 mg total) by mouth daily. 08/06/14   Hayden Rasmussen, NP  Levothyroxine Sodium (SYNTHROID PO) Take by mouth.    Historical Provider, MD  lisinopril (PRINIVIL,ZESTRIL) 20 MG  tablet Take 1 tablet (20 mg total) by mouth daily. 03/24/14   Adrian Blackwater Baker, PA-C  lisinopril (PRINIVIL,ZESTRIL) 20 MG tablet Take 1 tablet (20 mg total) by mouth daily. 08/06/14   Hayden Rasmussen, NP  sertraline (ZOLOFT) 100 MG tablet Take 100 mg by mouth daily.    Historical Provider, MD  TraZODone HCl (DESYREL PO) Take by mouth.    Historical Provider, MD   BP 164/97 mmHg  Pulse 57  Temp(Src) 97.5 F (36.4 C) (Oral)  Resp 15  Ht  (1.753 m)  Wt 250 lb (113.399 kg)  BMI 36.90 kg/m2  SpO2 100% Physical Exam  Constitutional: He is oriented to person, place, and time. He appears well-developed. No distress.  HENT:  Head: Normocephalic and atraumatic.  Eyes: Conjunctivae and EOM are normal.  Cardiovascular: Normal rate and regular rhythm.   Pulmonary/Chest: Effort normal. No stridor. No respiratory distress.  Abdominal: He exhibits no distension.  Musculoskeletal: He exhibits no edema.  Neurological: He is alert and oriented to person, place, and time.  Skin: Skin is warm and dry.  Psychiatric: He has a normal mood and affect.  Nursing note and vitals reviewed.   ED Course  Procedures (including critical care time) Labs Review Labs Reviewed  BASIC METABOLIC PANEL  CBC WITH DIFFERENTIAL  URINALYSIS, ROUTINE W REFLEX MICROSCOPIC  I-STAT TROPOININ, ED    Imaging Review No results found.   EKG Interpretation   Date/Time:  Tuesday October 16 2014 18:21:14 EST Ventricular Rate:  64 PR Interval:  150 QRS Duration: 76 QT Interval:  412 QTC Calculation: 425 R Axis:   54 Text Interpretation:  Normal sinus rhythm Nonspecific T wave abnormality  Abnormal ECG Sinus rhythm T wave abnormality Abnormal ekg Confirmed by  Gerhard MunchLOCKWOOD, Krista Som  MD ([id number]) on 10/16/2014 9:10:23 PM     O2- 99% ra, nml  Cardiac: 80 sr, nml  Initial BP elevated - home meds given.   MDM   Patient presents with multiple complaints, but essentially has cough, generalized discomfort following cessation  of his prescribed medication. Patient does have occasional chest pain, but no evidence for ongoing coronary ischemia, and given his intermittent episodes, with normal troponin, there is low suspicion for this. No evidence for occult infection, nor PE. Patient was restarted on medication, discharged to follow-up with primary care.   Gerhard Munchobert Miya Luviano, MD 10/16/14 2219

## 2014-10-16 NOTE — ED Notes (Addendum)
Pt reports dry cough and congestion for a few weeks. Pt also reports he has been out of bp medications for 2-3 weeks. C/o intermittent dizziness and cp

## 2014-11-21 ENCOUNTER — Encounter: Payer: Self-pay | Admitting: Internal Medicine

## 2014-11-21 ENCOUNTER — Ambulatory Visit: Payer: Self-pay | Attending: Internal Medicine | Admitting: Internal Medicine

## 2014-11-21 VITALS — BP 160/96 | HR 78 | Temp 98.1°F | Resp 16 | Ht 69.0 in | Wt 218.0 lb

## 2014-11-21 DIAGNOSIS — Z8639 Personal history of other endocrine, nutritional and metabolic disease: Secondary | ICD-10-CM | POA: Insufficient documentation

## 2014-11-21 DIAGNOSIS — I10 Essential (primary) hypertension: Secondary | ICD-10-CM | POA: Insufficient documentation

## 2014-11-21 DIAGNOSIS — Z8673 Personal history of transient ischemic attack (TIA), and cerebral infarction without residual deficits: Secondary | ICD-10-CM | POA: Insufficient documentation

## 2014-11-21 DIAGNOSIS — Z23 Encounter for immunization: Secondary | ICD-10-CM | POA: Insufficient documentation

## 2014-11-21 DIAGNOSIS — E079 Disorder of thyroid, unspecified: Secondary | ICD-10-CM | POA: Insufficient documentation

## 2014-11-21 DIAGNOSIS — J45909 Unspecified asthma, uncomplicated: Secondary | ICD-10-CM | POA: Insufficient documentation

## 2014-11-21 MED ORDER — ASPIRIN EC 81 MG PO TBEC: 81.0000 mg | DELAYED_RELEASE_TABLET | Freq: Every day | ORAL | Status: DC

## 2014-11-21 MED ORDER — AMLODIPINE BESYLATE 10 MG PO TABS: 10.0000 mg | ORAL_TABLET | Freq: Every day | ORAL | Status: DC

## 2014-11-21 MED ORDER — LOSARTAN POTASSIUM 50 MG PO TABS: 50.0000 mg | ORAL_TABLET | Freq: Every day | ORAL | Status: DC

## 2014-11-21 NOTE — Progress Notes (Signed)
Pt is here to establish care. Pt has a history of HTN and asthma. Pt reports having a head cold for about a week.

## 2014-11-21 NOTE — Progress Notes (Signed)
Patient ID: Gene Garcia, male   DOB: 10-Apr-1961, 54 y.o.   MRN: [id number]   WUJ:[id number]  [id number]  DOB - 1961-02-10  CC:  Chief Complaint  Patient presents with  . Establish Care       HPI: Gene Garcia is a 54 y.o. male here today to establish medical care. Patient has a past medical history of hypertension.  He has been going to the local emergency room for medication refills.  He currently takes Norvasc 10 mg and Lisinopril 20 mg QD.  He has noticed a dry hacking cough for the past 2 months.  He reports that he currently lives in the [facility] and has noticed several individuals that are sick.  Patient denies any complications with medications.  He does not follow a low sodium diet due to food restrictions of living in the [facility]. He does not exercise regularly.  He has been clean from crack-cocaine for one year.  He does note a history of thyroid disease and has been out of synthroid for over one year.   He reports that he was in prison and was found to have a stroke but he is unsure of what hospital he was seen at. He reports that he may be unable to get the records for review.   No Known Allergies Past Medical History  Diagnosis Date  . Thyroid disease   . Hypertension   . Asthma    Current Outpatient Prescriptions on File Prior to Visit  Medication Sig Dispense Refill  . Albuterol (VENTOLIN IN) Inhale into the lungs.    Marland Kitchen amLODipine (NORVASC) 10 MG tablet Take 1 tablet (10 mg total) by mouth daily. 30 tablet 0  . Levothyroxine Sodium (SYNTHROID PO) Take by mouth.    Marland Kitchen lisinopril (PRINIVIL,ZESTRIL) 20 MG tablet Take 1 tablet (20 mg total) by mouth daily. 30 tablet 0  . sertraline (ZOLOFT) 100 MG tablet Take 100 mg by mouth daily.    . TraZODone HCl (DESYREL PO) Take by mouth.     No current facility-administered medications on file prior to visit.   No family history on file. History   Social History  . Marital Status: Single    Spouse Name: N/A  .  Number of Children: N/A  . Years of Education: N/A   Occupational History  . Not on file.   Social History Main Topics  . Smoking status: Never Smoker   . Smokeless tobacco: Not on file  . Alcohol Use: No  . Drug Use: No  . Sexual Activity: Not on file   Other Topics Concern  . Not on file   Social History Narrative    Review of Systems  Constitutional: Positive for malaise/fatigue. Negative for weight loss.  HENT: Positive for congestion.        Rhinitis  Sneezing   Eyes: Negative.   Respiratory: Negative.   Cardiovascular: Negative.   Gastrointestinal: Negative for constipation.  Genitourinary: Positive for frequency. Negative for dysuria and urgency.       Nocturia  Musculoskeletal: Negative.   Skin: Negative.   Neurological: Positive for dizziness and weakness. Negative for headaches.       History of stroke from [place]  Psychiatric/Behavioral: Positive for depression (previosuly on zoloft) and substance abuse (resolved). The patient is not nervous/anxious and does not have insomnia.        Objective:   Filed Vitals:   11/21/14 1014  BP: 160/96  Pulse: 78  Temp: 98.1 F (36.7 C)  Resp: 16    Physical Exam: Constitutional: Patient appears well-developed and well-nourished. No distress. HENT: Normocephalic, atraumatic, External right and left ear normal. Oropharynx is clear and moist.  Eyes: Conjunctivae and EOM are normal. PERRLA, no scleral icterus. Neck: Normal ROM. Neck supple. No JVD. No tracheal deviation. No thyromegaly. CVS: RRR, S1/S2 +, no murmurs, no gallops, no carotid bruit.  Pulmonary: Effort and breath sounds normal, no stridor, rhonchi, wheezes, rales.  Abdominal: Soft. BS +, no distension, tenderness, rebound or guarding.  Musculoskeletal: Normal range of motion. No edema and no tenderness.  Lymphadenopathy: No lymphadenopathy noted, cervical, inguinal or axillary Neuro: Alert. Normal reflexes, muscle tone coordination. No cranial nerve  deficit. Skin: Skin is warm and dry. No rash noted. Not diaphoretic. No erythema. No pallor. Psychiatric: Normal mood and affect. Behavior, judgment, thought content normal.  Refused prostate exam   Lab Results  Component Value Date   WBC 5.4 10/16/2014   HGB 12.7* 10/16/2014   HCT 38.6* 10/16/2014   MCV 84.1 10/16/2014   PLT 123* 10/16/2014   Lab Results  Component Value Date   CREATININE 1.20 10/16/2014   BUN 14 10/16/2014   NA 140 10/16/2014   K 3.8 10/16/2014   CL 109 10/16/2014   CO2 26 10/16/2014    No results found for: HGBA1C Lipid Panel  No results found for: CHOL, TRIG, HDL, CHOLHDL, VLDL, LDLCALC     Assessment and plan:   Gene Garcia was seen today for establish care.  Diagnoses and all orders for this visit:  Essential hypertension Orders: -     Refill amLODipine (NORVASC) 10 MG tablet; Take 1 tablet (10 mg total) by mouth daily. -     Begin losartan (COZAAR) 50 MG tablet; Take 1 tablet (50 mg total) by mouth daily. -     PSA  History of stroke Orders: -     Begin aspirin EC 81 MG tablet; Take 1 tablet (81 mg total) by mouth daily. -     Lipid panel; Future  History of thyroid disease Orders: -     TSH -     T4, Free  Need for prophylactic vaccination and inoculation against influenza -     Flu Vaccine QUAD 36+ mos IM   Return in about 2 weeks (around 12/05/2014) for Nurse Visit-BP check and 3 mo PCP.  Will give pneumovax and get lipid on next exam. Will need statin therapy once levels return.       [name], [name], NP-C [facility] [phone] 11/21/2014, 10:11 AM

## 2014-11-21 NOTE — Patient Instructions (Signed)
DASH Eating Plan °DASH stands for "Dietary Approaches to Stop Hypertension." The DASH eating plan is a healthy eating plan that has been shown to reduce high blood pressure (hypertension). Additional health benefits may include reducing the risk of type 2 diabetes mellitus, heart disease, and stroke. The DASH eating plan may also help with weight loss. °WHAT DO I NEED TO KNOW ABOUT THE DASH EATING PLAN? °For the DASH eating plan, you will follow these general guidelines: °· Choose foods with a percent daily value for sodium of less than 5% (as listed on the food label). °· Use salt-free seasonings or herbs instead of table salt or sea salt. °· Check with your health care provider or pharmacist before using salt substitutes. °· Eat lower-sodium products, often labeled as "lower sodium" or "no salt added." °· Eat fresh foods. °· Eat more vegetables, fruits, and low-fat dairy products. °· Choose whole grains. Look for the word "whole" as the first word in the ingredient list. °· Choose fish and skinless chicken or turkey more often than red meat. Limit fish, poultry, and meat to 6 oz (170 g) each day. °· Limit sweets, desserts, sugars, and sugary drinks. °· Choose heart-healthy fats. °· Limit cheese to 1 oz (28 g) per day. °· Eat more home-cooked food and less restaurant, buffet, and fast food. °· Limit fried foods. °· Cook foods using methods other than frying. °· Limit canned vegetables. If you do use them, rinse them well to decrease the sodium. °· When eating at a restaurant, ask that your food be prepared with less salt, or no salt if possible. °WHAT FOODS CAN I EAT? °Seek help from a dietitian for individual calorie needs. °Grains °Whole grain or whole wheat bread. Brown rice. Whole grain or whole wheat pasta. Quinoa, bulgur, and whole grain cereals. Low-sodium cereals. Corn or whole wheat flour tortillas. Whole grain cornbread. Whole grain crackers. Low-sodium crackers. °Vegetables °Fresh or frozen vegetables  (raw, steamed, roasted, or grilled). Low-sodium or reduced-sodium tomato and vegetable juices. Low-sodium or reduced-sodium tomato sauce and paste. Low-sodium or reduced-sodium canned vegetables.  °Fruits °All fresh, canned (in natural juice), or frozen fruits. °Meat and Other Protein Products °Ground beef (85% or leaner), grass-fed beef, or beef trimmed of fat. Skinless chicken or turkey. Ground chicken or turkey. Pork trimmed of fat. All fish and seafood. Eggs. Dried beans, peas, or lentils. Unsalted nuts and seeds. Unsalted canned beans. °Dairy °Low-fat dairy products, such as skim or 1% milk, 2% or reduced-fat cheeses, low-fat ricotta or cottage cheese, or plain low-fat yogurt. Low-sodium or reduced-sodium cheeses. °Fats and Oils °Tub margarines without trans fats. Light or reduced-fat mayonnaise and salad dressings (reduced sodium). Avocado. Safflower, olive, or canola oils. Natural peanut or almond butter. °Other °Unsalted popcorn and pretzels. °The items listed above may not be a complete list of recommended foods or beverages. Contact your dietitian for more options. °WHAT FOODS ARE NOT RECOMMENDED? °Grains °White bread. White pasta. White rice. Refined cornbread. Bagels and croissants. Crackers that contain trans fat. °Vegetables °Creamed or fried vegetables. Vegetables in a cheese sauce. Regular canned vegetables. Regular canned tomato sauce and paste. Regular tomato and vegetable juices. °Fruits °Dried fruits. Canned fruit in light or heavy syrup. Fruit juice. °Meat and Other Protein Products °Fatty cuts of meat. Ribs, chicken wings, bacon, sausage, bologna, salami, chitterlings, fatback, hot dogs, bratwurst, and packaged luncheon meats. Salted nuts and seeds. Canned beans with salt. °Dairy °Whole or 2% milk, cream, half-and-half, and cream cheese. Whole-fat or sweetened yogurt. Full-fat   cheeses or blue cheese. Nondairy creamers and whipped toppings. Processed cheese, cheese spreads, or cheese  curds. °Condiments °Onion and garlic salt, seasoned salt, table salt, and sea salt. Canned and packaged gravies. Worcestershire sauce. Tartar sauce. Barbecue sauce. Teriyaki sauce. Soy sauce, including reduced sodium. Steak sauce. Fish sauce. Oyster sauce. Cocktail sauce. Horseradish. Ketchup and mustard. Meat flavorings and tenderizers. Bouillon cubes. Hot sauce. Tabasco sauce. Marinades. Taco seasonings. Relishes. °Fats and Oils °Butter, stick margarine, lard, shortening, ghee, and bacon fat. Coconut, palm kernel, or palm oils. Regular salad dressings. °Other °Pickles and olives. Salted popcorn and pretzels. °The items listed above may not be a complete list of foods and beverages to avoid. Contact your dietitian for more information. °WHERE CAN I FIND MORE INFORMATION? °National Heart, Lung, and Blood Institute: www.nhlbi.nih.gov/health/health-topics/topics/dash/ °Document Released: 09/17/2011 Document Revised: 02/12/2014 Document Reviewed: 08/02/2013 °ExitCare® Patient Information ©2015 [employer]. This information is not intended to replace advice given to you by your health care provider. Make sure you discuss any questions you have with your health care provider. ° °

## 2014-11-22 LAB — T4, FREE: FREE T4: 0.64 ng/dL — AB (ref 0.80–1.80)

## 2014-11-22 LAB — PSA: PSA: 0.33 ng/mL (ref <=4.00)

## 2014-11-22 LAB — TSH: TSH: 6.643 u[IU]/mL — ABNORMAL HIGH (ref 0.350–4.500)

## 2014-11-25 ENCOUNTER — Encounter: Payer: Self-pay | Admitting: Internal Medicine

## 2014-11-27 ENCOUNTER — Telehealth: Payer: Self-pay | Admitting: Emergency Medicine

## 2014-11-27 ENCOUNTER — Telehealth: Payer: Self-pay | Admitting: Internal Medicine

## 2014-11-27 DIAGNOSIS — E079 Disorder of thyroid, unspecified: Secondary | ICD-10-CM

## 2014-11-27 MED ORDER — LEVOTHYROXINE SODIUM 25 MCG PO CAPS: 25.0000 ug | ORAL_CAPSULE | Freq: Every day | ORAL | Status: DC

## 2014-11-27 NOTE — Telephone Encounter (Signed)
Pt aware of lab results 

## 2014-11-27 NOTE — Telephone Encounter (Signed)
-----   Message from Ambrose FinlandValerie A [name], NP sent at 11/23/2014 10:54 PM EST ----- Start patient back on Synthroid 25 mcg QD, take at least 30 minutes before meals or other medications. Please place future order for patient to come back in 2 months for a repeat TSH. Thanks. Send 30 tables with 2 refills

## 2014-11-27 NOTE — Telephone Encounter (Signed)
[facility] call to give lab results with instructions to take Levothyroxine 25 mcg daily Medication e-scribed to [facility] pharmacy Future TSH order placed

## 2014-12-05 ENCOUNTER — Ambulatory Visit: Payer: Self-pay | Attending: Internal Medicine

## 2014-12-05 DIAGNOSIS — E079 Disorder of thyroid, unspecified: Secondary | ICD-10-CM

## 2014-12-05 DIAGNOSIS — Z8673 Personal history of transient ischemic attack (TIA), and cerebral infarction without residual deficits: Secondary | ICD-10-CM

## 2014-12-05 LAB — LIPID PANEL
Cholesterol: 216 mg/dL — ABNORMAL HIGH (ref 0–200)
HDL: 55 mg/dL (ref >=40)
LDL CALC: 147 mg/dL — AB (ref 0–99)
TRIGLYCERIDES: 71 mg/dL (ref <150)
Total CHOL/HDL Ratio: 3.9 Ratio
VLDL: 14 mg/dL (ref 0–40)

## 2014-12-05 LAB — TSH: TSH: 6.718 u[IU]/mL — ABNORMAL HIGH (ref 0.350–4.500)

## 2014-12-10 ENCOUNTER — Telehealth: Payer: Self-pay | Admitting: *Deleted

## 2014-12-10 DIAGNOSIS — Z8639 Personal history of other endocrine, nutritional and metabolic disease: Secondary | ICD-10-CM

## 2014-12-10 NOTE — Telephone Encounter (Signed)
-----   Message from Ambrose FinlandValerie A [name], NP sent at 12/09/2014  7:16 PM EST ----- Patient's cholesterol is elevated, please go over diet specifics to outpatient lower cholesterol. If cholesterol is not improved in 6 months we will begin statin therapy. His thyroid level is elevated please refill patient Synthroid 25 mcg. Will repeat TSH level in 6-8 Weeks, Please place future order

## 2014-12-10 NOTE — Telephone Encounter (Signed)
Lab placed LVM with Christ, to return call

## 2015-04-02 ENCOUNTER — Telehealth: Payer: Self-pay | Admitting: Internal Medicine

## 2015-04-02 ENCOUNTER — Other Ambulatory Visit: Payer: Self-pay | Admitting: Internal Medicine

## 2015-04-09 ENCOUNTER — Encounter: Payer: Self-pay | Admitting: Internal Medicine

## 2015-04-09 ENCOUNTER — Ambulatory Visit: Payer: Self-pay | Attending: Internal Medicine | Admitting: Internal Medicine

## 2015-04-09 VITALS — BP 120/89 | HR 60 | Temp 98.2°F | Resp 18 | Wt 197.4 lb

## 2015-04-09 DIAGNOSIS — I10 Essential (primary) hypertension: Secondary | ICD-10-CM

## 2015-04-09 DIAGNOSIS — E039 Hypothyroidism, unspecified: Secondary | ICD-10-CM

## 2015-04-09 DIAGNOSIS — R634 Abnormal weight loss: Secondary | ICD-10-CM

## 2015-04-09 MED ORDER — AMLODIPINE BESYLATE 10 MG PO TABS: 10.0000 mg | ORAL_TABLET | Freq: Every day | ORAL | Status: DC

## 2015-04-09 MED ORDER — LEVOTHYROXINE SODIUM 25 MCG PO CAPS
25.0000 ug | ORAL_CAPSULE | Freq: Every day | ORAL | Status: DC
Start: 2015-04-09 — End: 2015-10-07

## 2015-04-09 MED ORDER — LOSARTAN POTASSIUM 50 MG PO TABS: 50.0000 mg | ORAL_TABLET | Freq: Every day | ORAL | Status: DC

## 2015-04-09 NOTE — Progress Notes (Signed)
Patient ID: Gene Garcia, male   DOB: March 27, 1961, 54 y.o.   MRN: [id number] Subjective:  Gene Garcia is a 54 y.o. male with hypertension.  Current Outpatient Prescriptions  Medication Sig Dispense Refill  . amLODipine (NORVASC) 10 MG tablet Take 1 tablet (10 mg total) by mouth daily. 30 tablet 4  . aspirin EC 81 MG tablet Take 1 tablet (81 mg total) by mouth daily. 60 tablet 5  . Levothyroxine Sodium 25 MCG CAPS Take 1 capsule (25 mcg total) by mouth daily before breakfast. 30 capsule 2  . losartan (COZAAR) 50 MG tablet Take 1 tablet (50 mg total) by mouth daily. 30 tablet 4  . Albuterol (VENTOLIN IN) Inhale into the lungs.     No current facility-administered medications for this visit.    Hypertension ROS: taking medications as instructed, no medication side effects noted, no TIA's, no chest pain on exertion, no dyspnea on exertion and no swelling of ankles.  New concerns: Patient reports unintentional weight loss over past 4 months. Review of charts reveal he has lost 21 pounds since last visit in February. He reports that he has been taking his Synthroid daily. He also complains of headcnes. Headaches have been present for 2-3 months starting in the front causing dizziness. No nausea, vomiting. Some nasal congestion, dry cough. Thought he was catching a cold.  He would like to be tested for HIV due to his history of drug use (cocaine).   Objective:  BP 120/89 mmHg  Pulse 60  Temp(Src) 98.2 F (36.8 C) (Oral)  Resp 18  Wt 197 lb 6.4 oz (89.54 kg)  SpO2 99%  Appearance alert, well appearing, and in no distress, oriented to person, place, and time and normal appearing weight. General exam BP noted to be well controlled today in office, S1, S2 normal, no gallop, no murmur, chest clear, no JVD, no HSM, no edema.  Lab review: labs are reviewed, up to date and normal.   Assessment:   Hypertension well controlled.   Gene Garcia was seen today for hypertension.  Diagnoses and all orders for  this visit:  Unintended weight loss Orders: -     HIV antibody (with reflex) -     RPR I have advised patient that he needs to get his colonoscopy. He refuses right now but I have advised him to get hospital discount anyway.   Hypothyroidism, unspecified hypothyroidism type Orders: -     Levothyroxine Sodium 25 MCG CAPS; Take 1 capsule (25 mcg total) by mouth daily before breakfast. Medication compliance discussed. Will get repeat TSH in 8 weeks.   Essential hypertension Orders: -     losartan (COZAAR) 50 MG tablet; Take 1 tablet (50 mg total) by mouth daily. -     amLODipine (NORVASC) 10 MG tablet; Take 1 tablet (10 mg total) by mouth daily. Plan:  Current treatment plan is effective, no change in therapy. Recommended sodium restriction..  Return if symptoms worsen or fail to improve.  [name], Lauren Aguayo, NP 04/09/2015 6:53 PM

## 2015-04-09 NOTE — Progress Notes (Signed)
  Pt present today for follow up on Hypertension. He is c/o having headaches over the last couple of weeks. He would like a refill on Levothyroxine.

## 2015-04-09 NOTE — Patient Instructions (Signed)
Please apply for hospital discount so we can get colonoscopy

## 2015-04-10 LAB — HIV ANTIBODY (ROUTINE TESTING W REFLEX): HIV 1&2 Ab, 4th Generation: NONREACTIVE

## 2015-04-10 LAB — RPR

## 2015-04-11 NOTE — Progress Notes (Signed)
Left message to call back  

## 2015-05-26 ENCOUNTER — Emergency Department (HOSPITAL_COMMUNITY): Payer: Self-pay

## 2015-05-26 ENCOUNTER — Emergency Department (HOSPITAL_COMMUNITY)
Admission: EM | Admit: 2015-05-26 | Discharge: 2015-05-26 | Disposition: A | Discharge status: Home / Self Care | Payer: Self-pay | Attending: Emergency Medicine | Admitting: Emergency Medicine

## 2015-05-26 ENCOUNTER — Encounter (HOSPITAL_COMMUNITY): Payer: Self-pay | Admitting: *Deleted

## 2015-05-26 DIAGNOSIS — W540XXA Bitten by dog, initial encounter: Secondary | ICD-10-CM | POA: Insufficient documentation

## 2015-05-26 DIAGNOSIS — E079 Disorder of thyroid, unspecified: Secondary | ICD-10-CM | POA: Insufficient documentation

## 2015-05-26 DIAGNOSIS — Z79899 Other long term (current) drug therapy: Secondary | ICD-10-CM | POA: Insufficient documentation

## 2015-05-26 DIAGNOSIS — J45909 Unspecified asthma, uncomplicated: Secondary | ICD-10-CM | POA: Insufficient documentation

## 2015-05-26 DIAGNOSIS — Y990 Civilian activity done for income or pay: Secondary | ICD-10-CM | POA: Insufficient documentation

## 2015-05-26 DIAGNOSIS — Y9289 Other specified places as the place of occurrence of the external cause: Secondary | ICD-10-CM | POA: Insufficient documentation

## 2015-05-26 DIAGNOSIS — Z23 Encounter for immunization: Secondary | ICD-10-CM | POA: Insufficient documentation

## 2015-05-26 DIAGNOSIS — I10 Essential (primary) hypertension: Secondary | ICD-10-CM | POA: Insufficient documentation

## 2015-05-26 DIAGNOSIS — Z7982 Long term (current) use of aspirin: Secondary | ICD-10-CM | POA: Insufficient documentation

## 2015-05-26 DIAGNOSIS — S51851A Open bite of right forearm, initial encounter: Secondary | ICD-10-CM | POA: Insufficient documentation

## 2015-05-26 DIAGNOSIS — S60511A Abrasion of right hand, initial encounter: Secondary | ICD-10-CM | POA: Insufficient documentation

## 2015-05-26 DIAGNOSIS — Y9389 Activity, other specified: Secondary | ICD-10-CM | POA: Insufficient documentation

## 2015-05-26 MED ORDER — AMOXICILLIN-POT CLAVULANATE 875-125 MG PO TABS
1.0000 | ORAL_TABLET | Freq: Once | ORAL | Status: AC
  Administered 2015-05-26: 1 via ORAL
  Filled 2015-05-26: qty 1

## 2015-05-26 MED ORDER — RABIES IMMUNE GLOBULIN 150 UNIT/ML IM INJ
20.0000 [IU]/kg | INJECTION | Freq: Once | INTRAMUSCULAR | Status: AC
  Administered 2015-05-26: 1800 [IU] via INTRAMUSCULAR
  Filled 2015-05-26: qty 12

## 2015-05-26 MED ORDER — TETANUS-DIPHTH-ACELL PERTUSSIS 5-2.5-18.5 LF-MCG/0.5 IM SUSP
0.5000 mL | Freq: Once | INTRAMUSCULAR | Status: AC
  Administered 2015-05-26: 0.5 mL via INTRAMUSCULAR
  Filled 2015-05-26: qty 0.5

## 2015-05-26 MED ORDER — RABIES VACCINE, PCEC IM SUSR
1.0000 mL | Freq: Once | INTRAMUSCULAR | Status: AC
  Administered 2015-05-26: 1 mL via INTRAMUSCULAR
  Filled 2015-05-26: qty 1

## 2015-05-26 MED ORDER — IBUPROFEN 400 MG PO TABS
800.0000 mg | ORAL_TABLET | Freq: Once | ORAL | Status: AC
  Administered 2015-05-26: 800 mg via ORAL
  Filled 2015-05-26: qty 2

## 2015-05-26 MED ORDER — AMOXICILLIN-POT CLAVULANATE 875-125 MG PO TABS: 1.0000 | ORAL_TABLET | Freq: Two times a day (BID) | ORAL | Status: DC

## 2015-05-26 NOTE — ED Notes (Signed)
Patient transported to X-ray 

## 2015-05-26 NOTE — Discharge Instructions (Signed)
Animal Bite Take anabiotic's as prescribed. Follow-up with your primary care doctor in 48 hours. Return for any increased redness or swelling to the area. An animal bite can result in a scratch on the skin, deep open cut, puncture of the skin, crush injury, or tearing away of the skin or a body part. Dogs are responsible for most animal bites. Children are bitten more often than adults. An animal bite can range from very mild to more serious. A small bite from your house pet is no cause for alarm. However, some animal bites can become infected or injure a bone or other tissue. You must seek medical care if:  The skin is broken and bleeding does not slow down or stop after 15 minutes.  The puncture is deep and difficult to clean (such as a cat bite).  Pain, warmth, redness, or pus develops around the wound.  The bite is from a stray animal or rodent. There may be a risk of rabies infection.  The bite is from a snake, raccoon, skunk, fox, coyote, or bat. There may be a risk of rabies infection.  The person bitten has a chronic illness such as diabetes, liver disease, or cancer, or the person takes medicine that lowers the immune system.  There is concern about the location and severity of the bite. It is important to clean and protect an animal bite wound right away to prevent infection. Follow these steps:  Clean the wound with plenty of water and soap.  Apply an antibiotic cream.  Apply gentle pressure over the wound with a clean towel or gauze to slow or stop bleeding.  Elevate the affected area above the heart to help stop any bleeding.  Seek medical care. Getting medical care within 8 hours of the animal bite leads to the best possible outcome. DIAGNOSIS  Your caregiver will most likely:  Take a detailed history of the animal and the bite injury.  Perform a wound exam.  Take your medical history. Blood tests or X-rays may be performed. Sometimes, infected bite wounds are  cultured and sent to a lab to identify the infectious bacteria.  TREATMENT  Medical treatment will depend on the location and type of animal bite as well as the patient's medical history. Treatment may include:  Wound care, such as cleaning and flushing the wound with saline solution, bandaging, and elevating the affected area.  Antibiotics.  Tetanus immunization.  Rabies immunization.  Leaving the wound open to heal. This is often done with animal bites, due to the high risk of infection. However, in certain cases, wound closure with stitches, wound adhesive, skin adhesive strips, or staples may be used. Infected bites that are left untreated may require intravenous (IV) antibiotics and surgical treatment in the hospital. HOME CARE INSTRUCTIONS  Follow your caregiver's instructions for wound care.  Take all medicines as directed.  If your caregiver prescribes antibiotics, take them as directed. Finish them even if you start to feel better.  Follow up with your caregiver for further exams or immunizations as directed. You may need a tetanus shot if:  You cannot remember when you had your last tetanus shot.  You have never had a tetanus shot.  The injury broke your skin. If you get a tetanus shot, your arm may swell, get red, and feel warm to the touch. This is common and not a problem. If you need a tetanus shot and you choose not to have one, there is a rare chance of getting  tetanus. Sickness from tetanus can be serious. SEEK MEDICAL CARE IF:  You notice warmth, redness, soreness, swelling, pus discharge, or a bad smell coming from the wound.  You have a red line on the skin coming from the wound.  You have a fever, chills, or a general ill feeling.  You have nausea or vomiting.  You have continued or worsening pain.  You have trouble moving the injured part.  You have other questions or concerns. MAKE SURE YOU:  Understand these instructions.  Will watch your  condition.  Will get help right away if you are not doing well or get worse. Document Released: 06/16/2011 Document Revised: 12/21/2011 Document Reviewed: 06/16/2011 [facility] Patient Information 2015 [place], [place]. This information is not intended to replace advice given to you by your health care provider. Make sure you discuss any questions you have with your health care provider.

## 2015-05-26 NOTE — ED Provider Notes (Signed)
CSN: [id number]     Arrival date & time 05/26/15  4098 History  This chart was scribed for Catha Gosselin, PA-C, working with Margarita Grizzle, MD by Chestine Spore, ED Scribe. The patient was seen in room TR09C/TR09C at 10:46 AM.    Chief Complaint  Patient presents with  . Animal Bite      The history is provided by the patient. No language interpreter was used.    HPI Comments: Gene Garcia is a 54 y.o. male who presents to the Emergency Department complaining of an animal bite onset 9:30 AM PTA. Pt notes that he was working at [employer] when a dog bit him on his right forearm. Pt notes that he was placing a dog back into the cage when another dog broke free from his cage and began attacking the dog. Pt attempted to break up the fight when he was bit by the aggressive dog. Pt is also apart of the [facility] program. Pt is unsure of his tetanus vaccination status. Pt reports that he has not tried any medications for the relief of his symptoms. Pt denies color change, rash, swelling, and any other symptoms. Denies allergies to medications.  Past Medical History  Diagnosis Date  . Thyroid disease   . Hypertension   . Asthma    History reviewed. No pertinent past surgical history. Family History  Problem Relation Age of Onset  . Diabetes Maternal Aunt    Social History  Substance Use Topics  . Smoking status: Never Smoker   . Smokeless tobacco: None  . Alcohol Use: No    Review of Systems  Musculoskeletal: Positive for myalgias. Negative for joint swelling and arthralgias.  Skin: Positive for wound (dog bite to right forearm). Negative for color change and rash.      Allergies  Review of patient's allergies indicates no known allergies.  Home Medications   Prior to Admission medications   Medication Sig Start Date End Date Taking? Authorizing Provider  Albuterol (VENTOLIN IN) Inhale into the lungs.    Historical Provider, MD  amLODipine (NORVASC) 10  MG tablet Take 1 tablet (10 mg total) by mouth daily. 04/09/15   Ambrose Finland, NP  amoxicillin-clavulanate (AUGMENTIN) 875-125 MG per tablet Take 1 tablet by mouth every 12 (twelve) hours. 05/26/15   Catha Gosselin, PA-C  aspirin EC 81 MG tablet Take 1 tablet (81 mg total) by mouth daily. 11/21/14   Ambrose Finland, NP  Levothyroxine Sodium 25 MCG CAPS Take 1 capsule (25 mcg total) by mouth daily before breakfast. 04/09/15   Ambrose Finland, NP  losartan (COZAAR) 50 MG tablet Take 1 tablet (50 mg total) by mouth daily. 04/09/15   Ambrose Finland, NP   BP 132/86 mmHg  Pulse 64  Temp(Src) 97.9 F (36.6 C) (Oral)  Resp 18  Wt 196 lb 3.4 oz (89 kg)  SpO2 99% Physical Exam  Constitutional: He is oriented to person, place, and time. He appears well-developed and well-nourished. No distress.  HENT:  Head: Normocephalic and atraumatic.  Eyes: EOM are normal.  Neck: Neck supple. No tracheal deviation present.  Cardiovascular: Normal rate.   Pulses:      Radial pulses are 2+ on the right side.  Pulmonary/Chest: Effort normal. No respiratory distress.  Musculoskeletal: Normal range of motion.  Neurological: He is alert and oriented to person, place, and time.  NVI.  Skin: Skin is warm and dry. Abrasion noted.  Single puncture wound to the right  forearm approximately 6 cm proximal to right wrist. 1 cm abrasion to dorsum of right hand with no active bleeding or drainage. Minimal edema but no surrounding erythema or streaking.  Psychiatric: He has a normal mood and affect. His behavior is normal.  Nursing note and vitals reviewed.   ED Course  Procedures (including critical care time) DIAGNOSTIC STUDIES: Oxygen Saturation is 99% on RA, nl by my interpretation.    COORDINATION OF CARE: 10:52 AM Discussed treatment plan with pt at bedside and pt agreed to plan.   Labs Review Labs Reviewed - No data to display  Imaging Review Dg Forearm Right  05/26/2015   CLINICAL DATA:  Dog bite.  EXAM:  RIGHT FOREARM - 2 VIEW  COMPARISON:  None.  FINDINGS: There is no evidence of fracture or other focal bone lesions. Soft tissues are unremarkable. No radiopaque foreign body is noted.  IMPRESSION: Normal right forearm.   Electronically Signed   By: Lupita Raider, M.D.   On: 05/26/2015 11:40   I, Patel-Mills, Chistian Kasler, personally reviewed and evaluated these images and lab results as part of my medical decision-making.    EKG Interpretation None      MDM   Final diagnoses:  Dog bite of forearm, right, initial encounter   Xray negative for foreign bodies. The wound was thoroughly irrigated using tap water and soap. Spoke to patient about remaining rabies vaccination. Gave patient strict return precautions and Rx for augmentin. Pt verbally agrees with plan. Pt given work note at this time. Medications  rabies vaccine (RABAVERT) injection 1 mL (1 mL Intramuscular Given 05/26/15 1221)  rabies immune globulin (HYPERAB) injection 1,800 Units (1,800 Units Intramuscular Given 05/26/15 1224)  Tdap (BOOSTRIX) injection 0.5 mL (0.5 mLs Intramuscular Given 05/26/15 1219)  amoxicillin-clavulanate (AUGMENTIN) 875-125 MG per tablet 1 tablet (1 tablet Oral Given 05/26/15 1216)  ibuprofen (ADVIL,MOTRIN) tablet 800 mg (800 mg Oral Given 05/26/15 1216)  I personally performed the services described in this documentation, which was scribed in my presence. The recorded information has been reviewed and is accurate.   Catha Gosselin, PA-C 05/26/15 1634  Margarita Grizzle, MD 05/28/15 1430

## 2015-05-26 NOTE — ED Notes (Signed)
Declined W/C at D/C and was escorted to lobby by RN. 

## 2015-05-26 NOTE — ED Notes (Signed)
Pt reports working at [employer] and has dog bite to right forearm, puncture wound noted to anterior forearm, bleeding controlled. Bandage applied, pt unsure if dog was current on shots.

## 2015-05-29 ENCOUNTER — Encounter (HOSPITAL_COMMUNITY): Payer: Self-pay | Admitting: Emergency Medicine

## 2015-05-29 ENCOUNTER — Emergency Department (INDEPENDENT_AMBULATORY_CARE_PROVIDER_SITE_OTHER)
Admission: EM | Admit: 2015-05-29 | Discharge: 2015-05-29 | Disposition: A | Discharge status: Home / Self Care | Payer: Self-pay | Source: Home / Self Care

## 2015-05-29 DIAGNOSIS — Z203 Contact with and (suspected) exposure to rabies: Secondary | ICD-10-CM

## 2015-05-29 MED ORDER — RABIES VACCINE, PCEC IM SUSR
1.0000 mL | Freq: Once | INTRAMUSCULAR | Status: AC
  Administered 2015-05-29: 1 mL via INTRAMUSCULAR

## 2015-05-29 MED ORDER — RABIES VACCINE, PCEC IM SUSR
INTRAMUSCULAR | Status: AC
  Filled 2015-05-29: qty 1

## 2015-05-29 NOTE — ED Notes (Signed)
Here for #2 rabies vaccination  No concerns

## 2015-05-29 NOTE — Discharge Instructions (Signed)
Thank you coming in today.   Next injection will be 06/02/15 on the left arm

## 2015-06-02 ENCOUNTER — Emergency Department (INDEPENDENT_AMBULATORY_CARE_PROVIDER_SITE_OTHER)
Admission: EM | Admit: 2015-06-02 | Discharge: 2015-06-02 | Disposition: A | Discharge status: Home / Self Care | Payer: Self-pay | Source: Home / Self Care

## 2015-06-02 ENCOUNTER — Encounter (HOSPITAL_COMMUNITY): Payer: Self-pay | Admitting: Emergency Medicine

## 2015-06-02 DIAGNOSIS — Z203 Contact with and (suspected) exposure to rabies: Secondary | ICD-10-CM

## 2015-06-02 MED ORDER — RABIES VACCINE, PCEC IM SUSR
INTRAMUSCULAR | Status: AC
  Filled 2015-06-02: qty 1

## 2015-06-02 MED ORDER — RABIES VACCINE, PCEC IM SUSR
1.0000 mL | Freq: Once | INTRAMUSCULAR | Status: AC
  Administered 2015-06-02: 1 mL via INTRAMUSCULAR

## 2015-06-02 NOTE — Discharge Instructions (Signed)
Last vaccination shot will be 8/28

## 2015-06-02 NOTE — ED Notes (Signed)
Here for rabies vaccination #3

## 2015-06-09 ENCOUNTER — Encounter (HOSPITAL_COMMUNITY): Payer: Self-pay | Admitting: Emergency Medicine

## 2015-06-09 ENCOUNTER — Emergency Department (INDEPENDENT_AMBULATORY_CARE_PROVIDER_SITE_OTHER)
Admission: EM | Admit: 2015-06-09 | Discharge: 2015-06-09 | Disposition: A | Discharge status: Home / Self Care | Payer: Self-pay | Source: Home / Self Care

## 2015-06-09 DIAGNOSIS — Z203 Contact with and (suspected) exposure to rabies: Secondary | ICD-10-CM

## 2015-06-09 MED ORDER — RABIES VACCINE, PCEC IM SUSR
INTRAMUSCULAR | Status: AC
  Filled 2015-06-09: qty 1

## 2015-06-09 MED ORDER — RABIES VACCINE, PCEC IM SUSR
1.0000 mL | Freq: Once | INTRAMUSCULAR | Status: AC
Start: 2015-06-09 — End: 2015-06-09
  Administered 2015-06-09: 1 mL via INTRAMUSCULAR

## 2015-06-09 NOTE — Discharge Instructions (Signed)
Return as needed

## 2015-08-04 ENCOUNTER — Encounter (HOSPITAL_COMMUNITY): Payer: Self-pay | Admitting: *Deleted

## 2015-08-04 ENCOUNTER — Emergency Department (HOSPITAL_COMMUNITY)
Admission: EM | Admit: 2015-08-04 | Discharge: 2015-08-04 | Disposition: A | Discharge status: Home / Self Care | Payer: Self-pay | Attending: Emergency Medicine | Admitting: Emergency Medicine

## 2015-08-04 ENCOUNTER — Emergency Department (HOSPITAL_COMMUNITY): Payer: Self-pay

## 2015-08-04 DIAGNOSIS — W540XXA Bitten by dog, initial encounter: Secondary | ICD-10-CM | POA: Insufficient documentation

## 2015-08-04 DIAGNOSIS — E079 Disorder of thyroid, unspecified: Secondary | ICD-10-CM | POA: Insufficient documentation

## 2015-08-04 DIAGNOSIS — J45909 Unspecified asthma, uncomplicated: Secondary | ICD-10-CM | POA: Insufficient documentation

## 2015-08-04 DIAGNOSIS — Y9289 Other specified places as the place of occurrence of the external cause: Secondary | ICD-10-CM | POA: Insufficient documentation

## 2015-08-04 DIAGNOSIS — Z79899 Other long term (current) drug therapy: Secondary | ICD-10-CM | POA: Insufficient documentation

## 2015-08-04 DIAGNOSIS — Y9389 Activity, other specified: Secondary | ICD-10-CM | POA: Insufficient documentation

## 2015-08-04 DIAGNOSIS — Y998 Other external cause status: Secondary | ICD-10-CM | POA: Insufficient documentation

## 2015-08-04 DIAGNOSIS — S61531A Puncture wound without foreign body of right wrist, initial encounter: Secondary | ICD-10-CM | POA: Insufficient documentation

## 2015-08-04 DIAGNOSIS — I10 Essential (primary) hypertension: Secondary | ICD-10-CM | POA: Insufficient documentation

## 2015-08-04 DIAGNOSIS — S81832A Puncture wound without foreign body, left lower leg, initial encounter: Secondary | ICD-10-CM | POA: Insufficient documentation

## 2015-08-04 MED ORDER — AMOXICILLIN-POT CLAVULANATE 875-125 MG PO TABS: 1.0000 | ORAL_TABLET | Freq: Two times a day (BID) | ORAL | Status: DC

## 2015-08-04 NOTE — Discharge Instructions (Signed)
Follow up for worsening symptoms such as redness or yellow, green drainage or if you have problems moving your wrist.

## 2015-08-04 NOTE — ED Notes (Signed)
[facility] and was [facility] by a [facility].

## 2015-08-04 NOTE — ED Provider Notes (Signed)
CSN: [id number]     Arrival date & time 08/04/15  [id number] History  By signing my name below, I, Gene Garcia, attest that this documentation has been prepared under the direction and in the presence of Gene LowerVrinda Lochlyn Zullo, NP. Electronically Signed: Placido SouLogan Garcia, ED Scribe. 08/04/2015. 9:48 AM.   Chief Complaint  Patient presents with  . Animal Bite   The history is provided by the patient. No language interpreter was used.    HPI Comments: Gene Garcia is a 54 y.o. male who presents to the Emergency Department complaining of two dog bites with 1 to his left medial ankle and the other along his right wrist, both with onset earlier this morning. Pt notes two lacerations to the affected regions, both with controlled bleeding. He [facility] and their immunizations are UTD. Pt notes having been bit by a dog in the past and says his immunizations were all updated during this occurrence. He denies any other associated symptoms.   Past Medical History  Diagnosis Date  . Thyroid disease   . Hypertension   . Asthma    No past surgical history on file. Family History  Problem Relation Age of Onset  . Diabetes Maternal Aunt    Social History  Substance Use Topics  . Smoking status: Never Smoker   . Smokeless tobacco: Not on file  . Alcohol Use: No    Review of Systems  Skin: Positive for wound.  All other systems reviewed and are negative.  Allergies  Review of patient's allergies indicates no known allergies.  Home Medications   Prior to Admission medications   Medication Sig Start Date End Date Taking? Authorizing Provider  Albuterol (VENTOLIN IN) Inhale into the lungs.    Historical Provider, MD  amLODipine (NORVASC) 10 MG tablet Take 1 tablet (10 mg total) by mouth daily. 04/09/15   Ambrose FinlandValerie A [name], NP  amoxicillin-clavulanate (AUGMENTIN) 875-125 MG per tablet Take 1 tablet by mouth every 12 (twelve) hours. 05/26/15   Catha GosselinHanna Patel-Mills,  PA-C  aspirin EC 81 MG tablet Take 1 tablet (81 mg total) by mouth daily. 11/21/14   Ambrose FinlandValerie A [name], NP  Levothyroxine Sodium 25 MCG CAPS Take 1 capsule (25 mcg total) by mouth daily before breakfast. 04/09/15   Ambrose FinlandValerie A [name], NP  losartan (COZAAR) 50 MG tablet Take 1 tablet (50 mg total) by mouth daily. 04/09/15   Ambrose FinlandValerie A [name], NP   There were no vitals taken for this visit. Physical Exam  Constitutional: He is oriented to person, place, and time. He appears well-developed and well-nourished.  HENT:  Head: Normocephalic and atraumatic.  Mouth/Throat: No oropharyngeal exudate.  Neck: Normal range of motion. No tracheal deviation present.  Cardiovascular: Normal rate.   Pulmonary/Chest: Effort normal. No respiratory distress.  Abdominal: Soft. There is no tenderness.  Musculoskeletal: Normal range of motion.  Neurological: He is alert and oriented to person, place, and time. He exhibits normal muscle tone. Coordination normal.  Skin: He is not diaphoretic.  Puncture wound  And abrasion noted to medical aspect of the left lower leg and right wrist. Full rom to extremities No redness or drainage noted. Pulses intact  Psychiatric: He has a normal mood and affect. His behavior is normal.  Nursing note and vitals reviewed.  ED Course  Procedures  COORDINATION OF CARE: 9:36 AM Pt presents today due to lacerations which resulted from 2 dog bites. Discussed treatment plan with pt at bedside including x-rays  of the affected regions and reevaluation based on results. Pt agreed to plan.  Labs Review Labs Reviewed - No data to display  Imaging Review Dg Wrist Complete Right  08/04/2015  CLINICAL DATA:  Dog bite to distal wrist EXAM: RIGHT WRIST - COMPLETE 3+ VIEW COMPARISON:  None. FINDINGS: No fracture or dislocation is seen. The joint spaces are preserved. Soft tissue swelling along the wrist. No radiopaque foreign body is seen. IMPRESSION: Soft tissue swelling along the wrist. No fracture,  dislocation, or radiopaque foreign body is seen. Electronically Signed   By: Charline Bills M.D.   On: 08/04/2015 10:39   Dg Tibia/fibula Left  08/04/2015  CLINICAL DATA:  54 year old male with acute left lower leg pain and swelling following dog bite. Initial encounter. EXAM: LEFT TIBIA AND FIBULA - 2 VIEW COMPARISON:  None. FINDINGS: Medial/posterior soft tissue swelling is identified overlying the distal lower leg. There is no evidence of fracture, subluxation or dislocation. No radiopaque foreign bodies are identified. Mild tricompartmental degenerative changes within the knee are noted. No focal bony lesions are present. IMPRESSION: Soft tissue swelling/ injury without acute bony abnormality or radiopaque foreign body. Electronically Signed   By: Harmon Pier M.D.   On: 08/04/2015 10:38   I have personally reviewed and evaluated these images as part of my medical decision-making.   EKG Interpretation None      MDM   Final diagnoses:  Dog bite    Discussed return precautions with pt. Neurovascularly intact. Wound cleaned by nursing staff. Tetanus is utd. Will start on augmentin  I personally performed the services described in this documentation, which was scribed in my presence. The recorded information has been reviewed and is accurate.    Gene Lower, NP 08/04/15 1054  Richardean Canal, MD 08/04/15 1539

## 2015-08-04 NOTE — ED Notes (Signed)
Declined W/C at D/C and was escorted to lobby by RN. 

## 2015-10-05 ENCOUNTER — Encounter (HOSPITAL_COMMUNITY): Payer: Self-pay

## 2015-10-05 ENCOUNTER — Emergency Department (HOSPITAL_COMMUNITY)
Admission: EM | Admit: 2015-10-05 | Discharge: 2015-10-05 | Disposition: A | Discharge status: Other Acute Inpatient Hospital | Payer: Self-pay | Attending: Emergency Medicine | Admitting: Emergency Medicine

## 2015-10-05 ENCOUNTER — Inpatient Hospital Stay (HOSPITAL_COMMUNITY)
Admission: AD | Admit: 2015-10-05 | Discharge: 2015-10-09 | DRG: 897 | Disposition: A | Discharge status: Home / Self Care | Payer: Federal, State, Local not specified - Other | Source: Intra-hospital | Attending: Psychiatry | Admitting: Psychiatry

## 2015-10-05 DIAGNOSIS — F1420 Cocaine dependence, uncomplicated: Secondary | ICD-10-CM | POA: Diagnosis not present

## 2015-10-05 DIAGNOSIS — Z8673 Personal history of transient ischemic attack (TIA), and cerebral infarction without residual deficits: Secondary | ICD-10-CM

## 2015-10-05 DIAGNOSIS — F14280 Cocaine dependence with cocaine-induced anxiety disorder: Principal | ICD-10-CM | POA: Diagnosis present

## 2015-10-05 DIAGNOSIS — F1994 Other psychoactive substance use, unspecified with psychoactive substance-induced mood disorder: Secondary | ICD-10-CM

## 2015-10-05 DIAGNOSIS — E860 Dehydration: Secondary | ICD-10-CM | POA: Insufficient documentation

## 2015-10-05 DIAGNOSIS — R45851 Suicidal ideations: Secondary | ICD-10-CM | POA: Insufficient documentation

## 2015-10-05 DIAGNOSIS — F329 Major depressive disorder, single episode, unspecified: Secondary | ICD-10-CM | POA: Diagnosis present

## 2015-10-05 DIAGNOSIS — F19980 Other psychoactive substance use, unspecified with psychoactive substance-induced anxiety disorder: Secondary | ICD-10-CM | POA: Clinically undetermined

## 2015-10-05 DIAGNOSIS — G4700 Insomnia, unspecified: Secondary | ICD-10-CM | POA: Diagnosis present

## 2015-10-05 DIAGNOSIS — Z590 Homelessness: Secondary | ICD-10-CM

## 2015-10-05 DIAGNOSIS — J45909 Unspecified asthma, uncomplicated: Secondary | ICD-10-CM | POA: Diagnosis present

## 2015-10-05 DIAGNOSIS — F419 Anxiety disorder, unspecified: Secondary | ICD-10-CM | POA: Diagnosis present

## 2015-10-05 DIAGNOSIS — E039 Hypothyroidism, unspecified: Secondary | ICD-10-CM | POA: Diagnosis present

## 2015-10-05 DIAGNOSIS — Z7982 Long term (current) use of aspirin: Secondary | ICD-10-CM | POA: Diagnosis not present

## 2015-10-05 DIAGNOSIS — E876 Hypokalemia: Secondary | ICD-10-CM | POA: Diagnosis present

## 2015-10-05 DIAGNOSIS — E079 Disorder of thyroid, unspecified: Secondary | ICD-10-CM | POA: Insufficient documentation

## 2015-10-05 DIAGNOSIS — F319 Bipolar disorder, unspecified: Secondary | ICD-10-CM | POA: Diagnosis present

## 2015-10-05 DIAGNOSIS — Z79899 Other long term (current) drug therapy: Secondary | ICD-10-CM | POA: Insufficient documentation

## 2015-10-05 DIAGNOSIS — Z833 Family history of diabetes mellitus: Secondary | ICD-10-CM

## 2015-10-05 DIAGNOSIS — F1924 Other psychoactive substance dependence with psychoactive substance-induced mood disorder: Secondary | ICD-10-CM

## 2015-10-05 DIAGNOSIS — I10 Essential (primary) hypertension: Secondary | ICD-10-CM | POA: Diagnosis present

## 2015-10-05 DIAGNOSIS — F1410 Cocaine abuse, uncomplicated: Secondary | ICD-10-CM

## 2015-10-05 DIAGNOSIS — R51 Headache: Secondary | ICD-10-CM | POA: Diagnosis present

## 2015-10-05 DIAGNOSIS — F1414 Cocaine abuse with cocaine-induced mood disorder: Secondary | ICD-10-CM | POA: Insufficient documentation

## 2015-10-05 DIAGNOSIS — F3289 Other specified depressive episodes: Secondary | ICD-10-CM

## 2015-10-05 DIAGNOSIS — F19239 Other psychoactive substance dependence with withdrawal, unspecified: Secondary | ICD-10-CM | POA: Clinically undetermined

## 2015-10-05 LAB — COMPREHENSIVE METABOLIC PANEL
ALT: 21 U/L (ref 17–63)
AST: 43 U/L — ABNORMAL HIGH (ref 15–41)
Albumin: 4.7 g/dL (ref 3.5–5.0)
Alkaline Phosphatase: 92 U/L (ref 38–126)
Anion gap: 9 (ref 5–15)
BUN: 22 mg/dL — ABNORMAL HIGH (ref 6–20)
CHLORIDE: 105 mmol/L (ref 101–111)
CO2: 27 mmol/L (ref 22–32)
CREATININE: 1.44 mg/dL — AB (ref 0.61–1.24)
Calcium: 9.9 mg/dL (ref 8.9–10.3)
GFR, EST NON AFRICAN AMERICAN: 53 mL/min — AB (ref >60)
Glucose, Bld: 95 mg/dL (ref 65–99)
POTASSIUM: 3.2 mmol/L — AB (ref 3.5–5.1)
Sodium: 141 mmol/L (ref 135–145)
TOTAL PROTEIN: 8.9 g/dL — AB (ref 6.5–8.1)
Total Bilirubin: 1.2 mg/dL (ref 0.3–1.2)

## 2015-10-05 LAB — RAPID URINE DRUG SCREEN, HOSP PERFORMED
Amphetamines: NOT DETECTED
BENZODIAZEPINES: NOT DETECTED
Barbiturates: NOT DETECTED
COCAINE: POSITIVE — AB
OPIATES: NOT DETECTED
Tetrahydrocannabinol: NOT DETECTED

## 2015-10-05 LAB — ACETAMINOPHEN LEVEL: Acetaminophen (Tylenol), Serum: <10 ug/mL — ABNORMAL LOW (ref 10–30)

## 2015-10-05 LAB — CBC WITH DIFFERENTIAL/PLATELET
BASOS ABS: 0 10*3/uL (ref 0.0–0.1)
Basophils Relative: 0 %
EOS ABS: 0 10*3/uL (ref 0.0–0.7)
Eosinophils Relative: 0 %
HEMATOCRIT: 43.4 % (ref 39.0–52.0)
Hemoglobin: 14.1 g/dL (ref 13.0–17.0)
LYMPHS ABS: 1.3 10*3/uL (ref 0.7–4.0)
Lymphocytes Relative: 18 %
MCH: 27.9 pg (ref 26.0–34.0)
MCHC: 32.5 g/dL (ref 30.0–36.0)
MCV: 85.8 fL (ref 78.0–100.0)
MONOS PCT: 5 %
Monocytes Absolute: 0.4 10*3/uL (ref 0.1–1.0)
Neutro Abs: 5.7 10*3/uL (ref 1.7–7.7)
Neutrophils Relative %: 77 %
PLATELETS: 116 10*3/uL — AB (ref 150–400)
RBC: 5.06 MIL/uL (ref 4.22–5.81)
RDW: 14.6 % (ref 11.5–15.5)
WBC: 7.4 10*3/uL (ref 4.0–10.5)

## 2015-10-05 LAB — SALICYLATE LEVEL: Salicylate Lvl: <4 mg/dL (ref 2.8–30.0)

## 2015-10-05 LAB — ETHANOL

## 2015-10-05 MED ORDER — IBUPROFEN 200 MG PO TABS: 600.0000 mg | ORAL_TABLET | Freq: Three times a day (TID) | ORAL | Status: DC | PRN

## 2015-10-05 MED ORDER — ASPIRIN EC 81 MG PO TBEC
81.0000 mg | DELAYED_RELEASE_TABLET | Freq: Every day | ORAL | Status: DC
  Administered 2015-10-05: 81 mg via ORAL
  Filled 2015-10-05: qty 1

## 2015-10-05 MED ORDER — TRAZODONE HCL 50 MG PO TABS
50.0000 mg | ORAL_TABLET | Freq: Every evening | ORAL | Status: DC | PRN
  Administered 2015-10-05: 50 mg via ORAL
  Filled 2015-10-05: qty 1

## 2015-10-05 MED ORDER — LEVOTHYROXINE SODIUM 25 MCG PO CAPS: 25.0000 ug | ORAL_CAPSULE | Freq: Every day | ORAL | Status: DC

## 2015-10-05 MED ORDER — LEVOTHYROXINE SODIUM 25 MCG PO TABS
25.0000 ug | ORAL_TABLET | Freq: Every day | ORAL | Status: DC
  Administered 2015-10-05: 25 ug via ORAL
  Filled 2015-10-05 (×2): qty 1

## 2015-10-05 MED ORDER — HYDROXYZINE HCL 25 MG PO TABS
25.0000 mg | ORAL_TABLET | Freq: Four times a day (QID) | ORAL | Status: DC | PRN
  Administered 2015-10-05: 25 mg via ORAL
  Filled 2015-10-05: qty 10
  Filled 2015-10-05: qty 1

## 2015-10-05 MED ORDER — ASPIRIN EC 81 MG PO TBEC
81.0000 mg | DELAYED_RELEASE_TABLET | Freq: Every day | ORAL | Status: DC
  Administered 2015-10-06 – 2015-10-09 (×4): 81 mg via ORAL
  Filled 2015-10-05 (×3): qty 1
  Filled 2015-10-05: qty 7
  Filled 2015-10-05 (×3): qty 1

## 2015-10-05 MED ORDER — AMLODIPINE BESYLATE 10 MG PO TABS
10.0000 mg | ORAL_TABLET | Freq: Every day | ORAL | Status: DC
  Administered 2015-10-06 – 2015-10-09 (×4): 10 mg via ORAL
  Filled 2015-10-05 (×3): qty 1
  Filled 2015-10-05: qty 2
  Filled 2015-10-05: qty 1
  Filled 2015-10-05: qty 7
  Filled 2015-10-05: qty 1

## 2015-10-05 MED ORDER — MAGNESIUM HYDROXIDE 400 MG/5ML PO SUSP: 30.0000 mL | Freq: Every day | ORAL | Status: DC | PRN

## 2015-10-05 MED ORDER — POTASSIUM CHLORIDE CRYS ER 10 MEQ PO TBCR
10.0000 meq | EXTENDED_RELEASE_TABLET | Freq: Once | ORAL | Status: AC
  Administered 2015-10-05: 10 meq via ORAL
  Filled 2015-10-05: qty 1

## 2015-10-05 MED ORDER — LEVOTHYROXINE SODIUM 25 MCG PO TABS
25.0000 ug | ORAL_TABLET | Freq: Every day | ORAL | Status: DC
  Administered 2015-10-06 – 2015-10-09 (×4): 25 ug via ORAL
  Filled 2015-10-05 (×7): qty 1
  Filled 2015-10-05: qty 7

## 2015-10-05 MED ORDER — AMLODIPINE BESYLATE 10 MG PO TABS
10.0000 mg | ORAL_TABLET | Freq: Every day | ORAL | Status: DC
  Administered 2015-10-05: 10 mg via ORAL
  Filled 2015-10-05: qty 1

## 2015-10-05 MED ORDER — INFLUENZA VAC SPLIT QUAD 0.5 ML IM SUSY
0.5000 mL | PREFILLED_SYRINGE | INTRAMUSCULAR | Status: AC
  Administered 2015-10-06: 0.5 mL via INTRAMUSCULAR
  Filled 2015-10-05: qty 0.5

## 2015-10-05 MED ORDER — ALUM & MAG HYDROXIDE-SIMETH 200-200-20 MG/5ML PO SUSP: 30.0000 mL | ORAL | Status: DC | PRN

## 2015-10-05 MED ORDER — ACETAMINOPHEN 325 MG PO TABS: 650.0000 mg | ORAL_TABLET | Freq: Four times a day (QID) | ORAL | Status: DC | PRN

## 2015-10-05 MED ORDER — ACETAMINOPHEN 325 MG PO TABS: 650.0000 mg | ORAL_TABLET | ORAL | Status: DC | PRN

## 2015-10-05 NOTE — BH Assessment (Signed)
[facility] Dr. Lolly MustacheArfeen. Patient signed voluntary consent to treat and ROI to no one. Patient states that he may request to change his ROI at a later time. Patient asked if the rooms would be similar and if he would have a roommate and was encouraged to ask the [facility]. Patient denies any questions or concerns at this time. Fax[facility] and provided to patients nurse for transport. Call [phone] for report and coordinate times with accepting nurses.   Davina PokeJoVea Baylon Santelli, LCSW Therapeutic Triage Specialist [facility] 10/05/2015 3:46 PM

## 2015-10-05 NOTE — ED Provider Notes (Signed)
CSN: [id number]     Arrival date & time 10/05/15  0559 History   First MD Initiated Contact with Patient 10/05/15 [phone]     Chief Complaint  Patient presents with  . Suicidal   (Consider location/radiation/quality/duration/timing/severity/associated sxs/prior Treatment) The history is provided by the patient. No language interpreter was used.    Mr. Hyacinth MeekerMiller is a 54 year old male with a past medical history of hypertension, thyroid disease, and asthma who presents [facility] [employer] [facility] [employer] [facility] stating that he was depressed and suicidal and has been for over a month. He states he is homeless and lonely. He denies having a plan of hurting himself. He denies wanting to hurt anyone else or any hallucinations. He states that he uses crack and that he last used it a couple of hours ago. He denies any other drugs or alcohol use. He denies any pain or physical complaint at this time.   Past Medical History  Diagnosis Date  . Thyroid disease   . Hypertension   . Asthma    History reviewed. No pertinent past surgical history. Family History  Problem Relation Age of Onset  . Diabetes Maternal Aunt    Social History  Substance Use Topics  . Smoking status: Never Smoker   . Smokeless tobacco: None  . Alcohol Use: No    Review of Systems  Constitutional: Negative for fever.  Respiratory: Negative for shortness of breath.   Cardiovascular: Negative for chest pain.  Gastrointestinal: Negative for abdominal pain.  Psychiatric/Behavioral: Positive for suicidal ideas.  All other systems reviewed and are negative.     Allergies  Review of patient's allergies indicates no known allergies.  Home Medications   Prior to Admission medications   Medication Sig Start Date End Date Taking? Authorizing Provider  amLODipine (NORVASC) 10 MG tablet Take 1 tablet (10 mg total) by mouth daily. 04/09/15  Yes Ambrose FinlandValerie A [name], NP  aspirin EC 81 MG tablet Take 1 tablet (81 mg total) by mouth daily.  11/21/14  Yes Ambrose FinlandValerie A [name], NP  Levothyroxine Sodium 25 MCG CAPS Take 1 capsule (25 mcg total) by mouth daily before breakfast. 04/09/15  Yes Ambrose FinlandValerie A [name], NP  losartan (COZAAR) 50 MG tablet Take 1 tablet (50 mg total) by mouth daily. 04/09/15  Yes Ambrose FinlandValerie A [name], NP  amoxicillin-clavulanate (AUGMENTIN) 875-125 MG tablet Take 1 tablet by mouth every 12 (twelve) hours. Patient not taking: Reported on 10/05/2015 08/04/15   Teressa LowerVrinda Pickering, NP   BP 134/83 mmHg  Pulse 79  Temp(Src) 98 F (36.7 C) (Oral)  Resp 18  Ht 5\' 9"  (1.753 m)  Wt 97.523 kg  BMI 31.74 kg/m2  SpO2 98% Physical Exam  Constitutional: He is oriented to person, place, and time. He appears well-developed and well-nourished.  Eyes: Conjunctivae are normal.  Neck: Neck supple.  Cardiovascular: Normal rate.   Pulmonary/Chest: Effort normal. No respiratory distress.  Musculoskeletal: Normal range of motion.  Neurological: He is alert and oriented to person, place, and time.  Skin: Skin is warm and dry.  Psychiatric: He exhibits a depressed mood. He expresses suicidal ideation. He expresses no homicidal ideation. He expresses no suicidal plans.  Depressed. Suicidal. No plan.  Nursing note and vitals reviewed.   ED Course  Procedures (including critical care time) Labs Review Labs Reviewed  COMPREHENSIVE METABOLIC PANEL - Abnormal; Notable for the following:    Potassium 3.2 (*)    BUN 22 (*)    Creatinine, Ser 1.44 (*)    Total  Protein 8.9 (*)    AST 43 (*)    GFR calc non Af Amer 53 (*)    All other components within normal limits  CBC WITH DIFFERENTIAL/PLATELET - Abnormal; Notable for the following:    Platelets 116 (*)    All other components within normal limits  URINE RAPID DRUG SCREEN, HOSP PERFORMED - Abnormal; Notable for the following:    Cocaine POSITIVE (*)    All other components within normal limits  ACETAMINOPHEN LEVEL - Abnormal; Notable for the following:    Acetaminophen (Tylenol), Serum <10  (*)    All other components within normal limits  ETHANOL  SALICYLATE LEVEL    Imaging Review No results found. I have personally reviewed and evaluated these lab results as part of my medical decision-making.   EKG Interpretation None      MDM   Final diagnoses:  Suicidal ideation  Hypokalemia  Dehydration  Cocaine abuse   Patient presents for feeling suicidal, depressed, and lonely for over a month. He denies having a plan. He is homeless and currently living in a motel.  According to his records he has no previous psych history. He denies wanting to hurt anyone else or having any hallucinations. He denied any pain. He reports last using crack a few hours ago.   His labs reveal mild dehydration and hypokalemia but otherwise are normal. His urine drug screen is positive for cocaine.  Holding orders were placed. Patient is stable.   Catha Gosselin, PA-C 10/05/15 1132  Dione Booze, MD 10/05/15 [phone]

## 2015-10-05 NOTE — [facility] Group Notes (Signed)
Adult Psychoeducational Group Note  Date:  10/05/2015 Time:  9:08 PM  Group Topic/Focus:  Wrap-Up Group:   The focus of this group is to help patients review their daily goal of treatment and discuss progress on daily workbooks.  Participation Level:  Active  Participation Quality:  Attentive  Affect:  Appropriate  Cognitive:  Appropriate  Insight: Appropriate  Engagement in Group:  Engaged  Modes of Intervention:  Discussion  Additional Comments:  Pt was very verbal during the movie and during group discussion. Pt stated that he didn't really like the movie and he felt that it didn't make sense.  Delia ChimesRufus, Karimah Winquist L 10/05/2015, 9:08 PM

## 2015-10-05 NOTE — Tx Team (Signed)
Initial Interdisciplinary Treatment Plan   PATIENT STRESSORS: Loss of Housing Medication change or noncompliance Substance abuse   PATIENT STRENGTHS: Wellsite geologistCommunication skills General fund of knowledge Supportive family/friends   PROBLEM LIST: Problem List/Patient Goals Date to be addressed Date deferred Reason deferred Estimated date of resolution  Suicidal ideation 10/05/15     Homeless 10/05/15     Substance abuse 10/05/15     "I want to stay clean from substances" 10/05/15      "I want to get my life straight" 10/05/15                              DISCHARGE CRITERIA:  Ability to meet basic life and health needs Adequate post-discharge living arrangements Improved stabilization in mood, thinking, and/or behavior Verbal commitment to aftercare and medication compliance  PRELIMINARY DISCHARGE PLAN: Outpatient therapy  PATIENT/FAMIILY INVOLVEMENT: This treatment plan has been presented to and reviewed with the patient, Gene Garcia.  The patient and family have been given the opportunity to ask questions and make suggestions.  Norm ParcelHeather V Phill Steck 10/05/2015, 6:18 PM

## 2015-10-05 NOTE — ED Notes (Signed)
[facility] that [facility] and [facility], he's homeless

## 2015-10-05 NOTE — Progress Notes (Signed)
D.  Pt pleasant on approach, denies complaints at this time.  Positive for evening wrap up group, interacting appropriately on the unit.  Minimal s/s of withdrawal at this time.  Denies SI/HI/hallucinations at this time.  A.  Support and encouragement offered, medication given as ordered  R.  Pt remains safe on the unit, will continue to monitor.

## 2015-10-05 NOTE — Progress Notes (Signed)
[facility]. Report called to [employer] RN. All belongings given to [facility].

## 2015-10-05 NOTE — Progress Notes (Signed)
Pt accepted to [facility]. Pt alert and cooperative. +SI "I started getting depressed and started getting high. I wanted to hurt myself, I don't have a plan but I will make a plan". -HI. Pt reports he has been homeless for over 30 years and [facility] [place] [facility] [place] [facility] "I started back using crack cocaine ($50 daily), I feel people don't like me, it could be my imagination." +A/H "They say I should leave, they say a lot of things." Emotional support and encouragement given.  Pt reports not taking his HTN medication since [facility]. Denies history of suicide attempts. Hx of multiple inpatient admissions in [place] for depression. Will monitor closely for evaluation and stabilization. Pt advised urine needed for UDS order.

## 2015-10-05 NOTE — ED Notes (Signed)
Urine collected and sent to lab.

## 2015-10-05 NOTE — Progress Notes (Signed)
Admission note: Gene Garcia was [facility] where he presented with SI with no plan, increasing depression, auditory hallucinations and being homeless.  He admitted that he has been out of his psychiatric medications for over a week and he is unable to tell writer what medications that he takes.  He has history of hypertension and hypothyroidism in which he has been off those medications as well.  He endorses suicidal ideation with no plan.  He denies hearing voices at this time but state that he cannot understand what they are telling him.  [facility].  He was given potassium in the ED for potassium level of 3.3.  Gene Garcia was pleasant and cooperative.  Skin assessment completed and no issues noted.  VSS.  Completed admission.  Reviewed admission packet and oriented him to the unit.  Q 15 minute checks initiated for safety.  Belongings secured in locker #13 (hoodie, cell phone, shoes).  Encouraged participation in group and unit activities.  We will continue to monitor the progress towards his goals.

## 2015-10-05 NOTE — Consult Note (Signed)
[facility] Consult   Reason for Consult:  Depression and suicidal thoughts Referring Physician:  EDP Patient Identification: Gene Garcia MRN:  [id number] Principal Diagnosis: Substance induced mood disorder ([place]) Diagnosis:   Patient Active Problem List   Diagnosis Date Noted  . Substance induced mood disorder ([place]) [F19.94] 10/05/2015  . Unintended weight loss [R63.4] 04/09/2015  . HTN (hypertension) [I10] 11/21/2014  . History of stroke [Z86.73] 11/21/2014  . History of thyroid disease [Z86.39] 11/21/2014    Total Time spent with patient: 30 minutes  Subjective:   Gene Garcia is a 54 y.o. male patient admitted with depression.  HPI:  Patient is 54 year old African-American man who walked into the emergency room because of severe depression and having suicidal thoughts and plan to kill himself.  He is homeless and recently left him [place]. He admitted using crack cocaine and endorse hallucination, paranoia, irritability poor sleep  And does not feel safe by himself.  Patient has multiple hospitalization in the past.  He moved from [place] to [place] 2 years ago. Patient is superficially cooperative but did not provide much detail.  He mentioned that he had a plan to kill himself but does not like to elaborate.  His UDS is pending. His chemistry shows mild elevation of creatinine, BUN 22 and potassium 3.2. He has limited social network.  Patient requires treatment and stabilization.  He has mild tremors. His blood alcohol level is less than 5.  Past Psychiatric History: multiple hospitalization in [place].  Risk to Self: Is patient at risk for suicide?: Yes Risk to Others:   Prior Inpatient Therapy:   Prior Outpatient Therapy:    Past Medical History:  Past Medical History  Diagnosis Date  . Thyroid disease   . Hypertension   . Asthma    History reviewed. No pertinent past surgical history. Family History:  Family History  Problem Relation Age  of Onset  . Diabetes Maternal Aunt    Family Psychiatric  History: Unknown Social History:  History  Alcohol Use No     History  Drug Use No    Social History   Social History  . Marital Status: Single    Spouse Name: N/A  . Number of Children: N/A  . Years of Education: N/A   Social History Main Topics  . Smoking status: Never Smoker   . Smokeless tobacco: None  . Alcohol Use: No  . Drug Use: No  . Sexual Activity: Not Asked   Other Topics Concern  . None   Social History Narrative   Additional Social History:                          Allergies:  No Known Allergies  Labs:  Results for orders placed or performed during the hospital encounter of 10/05/15 (from the past 48 hour(s))  Comprehensive metabolic panel     Status: Abnormal   Collection Time: 10/05/15  7:02 AM  Result Value Ref Range   Sodium 141 135 - 145 mmol/L   Potassium 3.2 (L) 3.5 - 5.1 mmol/L   Chloride 105 101 - 111 mmol/L   CO2 27 22 - 32 mmol/L   Glucose, Bld 95 65 - 99 mg/dL   BUN 22 (H) 6 - 20 mg/dL   Creatinine, Ser 1.44 (H) 0.61 - 1.24 mg/dL   Calcium 9.9 8.9 - 10.3 mg/dL   Total Protein 8.9 (H) 6.5 - 8.1 g/dL   Albumin  4.7 3.5 - 5.0 g/dL   AST 43 (H) 15 - 41 U/L   ALT 21 17 - 63 U/L   Alkaline Phosphatase 92 38 - 126 U/L   Total Bilirubin 1.2 0.3 - 1.2 mg/dL   GFR calc non Af Amer 53 (L) >60 mL/min   GFR calc Af Amer >60 >60 mL/min    Comment: (NOTE) The eGFR has been calculated using the CKD EPI equation. This calculation has not been validated in all clinical situations. eGFR's persistently <60 mL/min signify possible Chronic Kidney Disease.    Anion gap 9 5 - 15  Ethanol     Status: None   Collection Time: 10/05/15  7:02 AM  Result Value Ref Range   Alcohol, Ethyl (B) <5 <5 mg/dL    Comment:        LOWEST DETECTABLE LIMIT FOR SERUM ALCOHOL IS 5 mg/dL FOR MEDICAL PURPOSES ONLY   CBC with Diff     Status: Abnormal   Collection Time: 10/05/15  7:02 AM  Result  Value Ref Range   WBC 7.4 4.0 - 10.5 K/uL   RBC 5.06 4.22 - 5.81 MIL/uL   Hemoglobin 14.1 13.0 - 17.0 g/dL   HCT 12.7 98.0 - 07.7 %   MCV 85.8 78.0 - 100.0 fL   MCH 27.9 26.0 - 34.0 pg   MCHC 32.5 30.0 - 36.0 g/dL   RDW 61.8 63.6 - 35.0 %   Platelets 116 (L) 150 - 400 K/uL    Comment: REPEATED TO VERIFY PLATELET COUNT CONFIRMED BY SMEAR SPECIMEN CHECKED FOR CLOTS    Neutrophils Relative % 77 %   Lymphocytes Relative 18 %   Monocytes Relative 5 %   Eosinophils Relative 0 %   Basophils Relative 0 %   Neutro Abs 5.7 1.7 - 7.7 K/uL   Lymphs Abs 1.3 0.7 - 4.0 K/uL   Monocytes Absolute 0.4 0.1 - 1.0 K/uL   Eosinophils Absolute 0.0 0.0 - 0.7 K/uL   Basophils Absolute 0.0 0.0 - 0.1 K/uL   Smear Review MORPHOLOGY UNREMARKABLE   Acetaminophen level     Status: Abnormal   Collection Time: 10/05/15  7:02 AM  Result Value Ref Range   Acetaminophen (Tylenol), Serum <10 (L) 10 - 30 ug/mL    Comment:        THERAPEUTIC CONCENTRATIONS VARY SIGNIFICANTLY. A RANGE OF 10-30 ug/mL MAY BE AN EFFECTIVE CONCENTRATION FOR MANY PATIENTS. HOWEVER, SOME ARE BEST TREATED AT CONCENTRATIONS OUTSIDE THIS RANGE. ACETAMINOPHEN CONCENTRATIONS >150 ug/mL AT 4 HOURS AFTER INGESTION AND >50 ug/mL AT 12 HOURS AFTER INGESTION ARE OFTEN ASSOCIATED WITH TOXIC REACTIONS.   Salicylate level     Status: None   Collection Time: 10/05/15  7:02 AM  Result Value Ref Range   Salicylate Lvl <4.0 2.8 - 30.0 mg/dL    Current Facility-Administered Medications  Medication Dose Route Frequency Provider Last Rate Last Dose  . acetaminophen (TYLENOL) tablet 650 mg  650 mg Oral Q4H PRN Courteney Lyn Mackuen, MD      . amLODipine (NORVASC) tablet 10 mg  10 mg Oral Daily Courteney Lyn Mackuen, MD   10 mg at 10/05/15 0944  . aspirin EC tablet 81 mg  81 mg Oral Daily Courteney Lyn Mackuen, MD   81 mg at 10/05/15 0944  . ibuprofen (ADVIL,MOTRIN) tablet 600 mg  600 mg Oral Q8H PRN Courteney Lyn Mackuen, MD      .  levothyroxine (SYNTHROID, LEVOTHROID) tablet 25 mcg  25 mcg Oral QAC breakfast Courteney  Julio Alm, MD   25 mcg at 10/05/15 3299   Current Outpatient Prescriptions  Medication Sig Dispense Refill  . amLODipine (NORVASC) 10 MG tablet Take 1 tablet (10 mg total) by mouth daily. 30 tablet 4  . aspirin EC 81 MG tablet Take 1 tablet (81 mg total) by mouth daily. 60 tablet 5  . Levothyroxine Sodium 25 MCG CAPS Take 1 capsule (25 mcg total) by mouth daily before breakfast. 30 capsule 4  . losartan (COZAAR) 50 MG tablet Take 1 tablet (50 mg total) by mouth daily. 30 tablet 4  . amoxicillin-clavulanate (AUGMENTIN) 875-125 MG tablet Take 1 tablet by mouth every 12 (twelve) hours. (Patient not taking: Reported on 10/05/2015) 14 tablet 0    Musculoskeletal: Strength & Muscle Tone: within normal limits Gait & Station: normal Patient leans: N/A  Psychiatric Specialty Exam: Review of Systems  Constitutional: Positive for malaise/fatigue.  Cardiovascular: Negative for chest pain and palpitations.  Musculoskeletal: Negative.   Skin: Negative for itching and rash.  Neurological: Negative for dizziness, tingling and headaches.  Psychiatric/Behavioral: Positive for depression, suicidal ideas and hallucinations. The patient is nervous/anxious and has insomnia.     Blood pressure 134/83, pulse 79, temperature 98 F (36.7 C), temperature source Oral, resp. rate 18, height '5\' 9"'$  (1.753 m), weight 97.523 kg (215 lb), SpO2 98 %.Body mass index is 31.74 kg/(m^2).  General Appearance: Disheveled, Fairly Groomed and Guarded  Engineer, water::  Poor  Speech:  Slow  Volume:  Decreased  Mood:  Depressed, Dysphoric, Hopeless and Irritable  Affect:  Constricted, Depressed, Flat and Inappropriate  Thought Process:  Circumstantial, Disorganized, Loose and Tangential  Orientation:  Full (Time, Place, and Person)  Thought Content:  Hallucinations: Auditory and Paranoid Ideation  Suicidal Thoughts:  Yes.  with  intent/plan  Homicidal Thoughts:  No  Memory:  Immediate;   Fair Recent;   Poor Remote;   Poor  Judgement:  Impaired  Insight:  Lacking  Psychomotor Activity:  Decreased  Concentration:  Fair  Recall:  [place]: Fair  Akathisia:  No  Handed:  Right  AIMS (if indicated):     Assets:  Desire for Improvement  ADL's:  Intact  Cognition: WNL  Sleep:      Treatment Plan Summary: Daily contact with patient to assess and evaluate symptoms and progress in treatment, Medication management and Plan Patient requires inpatient treatment and stabilization.  Replace potassium, repeat Chemistry, restart his blood pressure medication and increase collateraland a start his psychotropic medication.  Disposition: Recommend psychiatric Inpatient admission when medically cleared.  Gene Colegrove T. 10/05/2015 10:57 AM

## 2015-10-06 ENCOUNTER — Encounter (HOSPITAL_COMMUNITY): Payer: Self-pay | Admitting: Psychiatry

## 2015-10-06 DIAGNOSIS — F3289 Other specified depressive episodes: Secondary | ICD-10-CM

## 2015-10-06 DIAGNOSIS — R45851 Suicidal ideations: Secondary | ICD-10-CM

## 2015-10-06 DIAGNOSIS — F19239 Other psychoactive substance dependence with withdrawal, unspecified: Secondary | ICD-10-CM | POA: Clinically undetermined

## 2015-10-06 DIAGNOSIS — F319 Bipolar disorder, unspecified: Secondary | ICD-10-CM | POA: Diagnosis present

## 2015-10-06 DIAGNOSIS — F1994 Other psychoactive substance use, unspecified with psychoactive substance-induced mood disorder: Secondary | ICD-10-CM

## 2015-10-06 DIAGNOSIS — F1420 Cocaine dependence, uncomplicated: Secondary | ICD-10-CM | POA: Clinically undetermined

## 2015-10-06 DIAGNOSIS — E876 Hypokalemia: Secondary | ICD-10-CM | POA: Diagnosis present

## 2015-10-06 DIAGNOSIS — F19980 Other psychoactive substance use, unspecified with psychoactive substance-induced anxiety disorder: Secondary | ICD-10-CM | POA: Clinically undetermined

## 2015-10-06 DIAGNOSIS — F1924 Other psychoactive substance dependence with psychoactive substance-induced mood disorder: Secondary | ICD-10-CM

## 2015-10-06 LAB — TSH: TSH: 8.705 u[IU]/mL — ABNORMAL HIGH (ref 0.350–4.500)

## 2015-10-06 LAB — POTASSIUM: Potassium: 3.3 mmol/L — ABNORMAL LOW (ref 3.5–5.1)

## 2015-10-06 MED ORDER — SERTRALINE HCL 25 MG PO TABS
25.0000 mg | ORAL_TABLET | Freq: Every day | ORAL | Status: DC
  Administered 2015-10-06 – 2015-10-07 (×2): 25 mg via ORAL
  Filled 2015-10-06 (×5): qty 1

## 2015-10-06 NOTE — H&P (Signed)
Psychiatric Admission Assessment Adult  Patient Identification: Gene Garcia MRN:  [id number] Date of Evaluation:  10/06/2015 Chief Complaint:  MDD Principal Diagnosis: Substance or medication-induced anxiety disorder [facility]) Diagnosis:   Patient Active Problem List   Diagnosis Date Noted  . Cocaine use disorder, severe, dependence (HCC) [F14.20] 10/06/2015  . 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 Substance or medication-induced anxiety disorder (HCC) [F19.980] 10/06/2015  . Hypokalemia [E87.6] 10/06/2015  . Suicidal ideation [R45.851]   . Unintended weight loss [R63.4] 04/09/2015  . HTN (hypertension) [I10] 11/21/2014  . History of stroke [Z86.73] 11/21/2014  . History of thyroid disease [Z86.39] 11/21/2014   History of Present Illness:PER ADMISSION NOTE-Quinterrius was admitted to room 301-1 from [facility] ED where he presented with SI with no plan, increasing depression, auditory hallucinations and being homeless. He admitted that he has been out of his psychiatric medications for over a week and he is unable to tell writer what medications that he takes. He has history of hypertension and hypothyroidism in which he has been off those medications as well. He endorses suicidal ideation with no plan. He denies hearing voices at this time but state that he cannot understand what they are telling him. He gets his OP services [facility]  ON Evaluation:Gene Garcia is awake, alert and oriented X4 , found resting in is bedroom. Denies suicidal or homicidal ideation at this time. Denies auditory or visual hallucination and does not appear to be responding to internal stimuli. Patient reports passed hospitalization and is unable to recall the medications that he was taken. Report that he always has thought of suicidal ideation without attempts, due to the sounds/voice that he hears. States his depression 8/10. Patient states "I just want  to rest" Support, encouragement and reassurance was provided.    Associated Signs/Symptoms: Depression Symptoms:  depressed mood, insomnia, fatigue, feelings of worthlessness/guilt, hopelessness, suicidal thoughts without plan, anxiety, (Hypo) Manic Symptoms:  Hallucinations, Anxiety Symptoms:  Excessive Worry, Social Anxiety, Psychotic Symptoms:  Hallucinations: Auditory Paranoia, PTSD Symptoms: Avoidance:  Foreshortened Future Total Time spent with patient: 45 minutes  Past Psychiatric History:   Risk to Self: Is patient at risk for suicide?: Yes Risk to Others:   Prior Inpatient Therapy:   Prior Outpatient Therapy:    Alcohol Screening: 1. How often do you have a drink containing alcohol?: Never 9. Have you or someone else been injured as a result of your drinking?: No 10. Has a relative or friend or a doctor or another health worker been concerned about your drinking or suggested you cut down?: No Alcohol Use Disorder Identification Test Final Score (AUDIT): 0 Brief Intervention: AUDIT score less than 7 or less-screening does not suggest unhealthy drinking-brief intervention not indicated Substance Abuse History in the last 12 months:  Yes.   Consequences of Substance Abuse: Withdrawal Symptoms:   Headaches Previous Psychotropic Medications: YES Psychological Evaluations: YES Past Medical History:  Past Medical History  Diagnosis Date  . Thyroid disease   . Hypertension   . Asthma    History reviewed. No pertinent past surgical history. Family History:  Family History  Problem Relation Age of Onset  . Diabetes Maternal Aunt    Family Psychiatric  History: unknown Social History:  History  Alcohol Use No     History  Drug Use  . Yes  . Special: "Crack" cocaine    Social History   Social History  . Marital Status: Single    Spouse Name:  N/A  . Number of Children: N/A  . Years of Education: N/A   Social History Main Topics  . Smoking status: Never  Smoker   . Smokeless tobacco: None  . Alcohol Use: No  . Drug Use: Yes    Special: "Crack" cocaine  . Sexual Activity: Not Asked   Other Topics Concern  . None   Social History Narrative   Additional Social History:                         Allergies:  No Known Allergies Lab Results:  Results for orders placed or performed during the hospital encounter of 10/05/15 (from the past 48 hour(s))  Comprehensive metabolic panel     Status: Abnormal   Collection Time: 10/05/15  7:02 AM  Result Value Ref Range   Sodium 141 135 - 145 mmol/L   Potassium 3.2 (L) 3.5 - 5.1 mmol/L   Chloride 105 101 - 111 mmol/L   CO2 27 22 - 32 mmol/L   Glucose, Bld 95 65 - 99 mg/dL   BUN 22 (H) 6 - 20 mg/dL   Creatinine, Ser 1.44 (H) 0.61 - 1.24 mg/dL   Calcium 9.9 8.9 - 10.3 mg/dL   Total Protein 8.9 (H) 6.5 - 8.1 g/dL   Albumin 4.7 3.5 - 5.0 g/dL   AST 43 (H) 15 - 41 U/L   ALT 21 17 - 63 U/L   Alkaline Phosphatase 92 38 - 126 U/L   Total Bilirubin 1.2 0.3 - 1.2 mg/dL   GFR calc non Af Amer 53 (L) >60 mL/min   GFR calc Af Amer >60 >60 mL/min    Comment: (NOTE) The eGFR has been calculated using the CKD EPI equation. This calculation has not been validated in all clinical situations. eGFR's persistently <60 mL/min signify possible Chronic Kidney Disease.    Anion gap 9 5 - 15  Ethanol     Status: None   Collection Time: 10/05/15  7:02 AM  Result Value Ref Range   Alcohol, Ethyl (B) <5 <5 mg/dL    Comment:        LOWEST DETECTABLE LIMIT FOR SERUM ALCOHOL IS 5 mg/dL FOR MEDICAL PURPOSES ONLY   CBC with Diff     Status: Abnormal   Collection Time: 10/05/15  7:02 AM  Result Value Ref Range   WBC 7.4 4.0 - 10.5 K/uL   RBC 5.06 4.22 - 5.81 MIL/uL   Hemoglobin 14.1 13.0 - 17.0 g/dL   HCT 43.4 39.0 - 52.0 %   MCV 85.8 78.0 - 100.0 fL   MCH 27.9 26.0 - 34.0 pg   MCHC 32.5 30.0 - 36.0 g/dL   RDW 14.6 11.5 - 15.5 %   Platelets 116 (L) 150 - 400 K/uL    Comment: REPEATED TO  VERIFY PLATELET COUNT CONFIRMED BY SMEAR SPECIMEN CHECKED FOR CLOTS    Neutrophils Relative % 77 %   Lymphocytes Relative 18 %   Monocytes Relative 5 %   Eosinophils Relative 0 %   Basophils Relative 0 %   Neutro Abs 5.7 1.7 - 7.7 K/uL   Lymphs Abs 1.3 0.7 - 4.0 K/uL   Monocytes Absolute 0.4 0.1 - 1.0 K/uL   Eosinophils Absolute 0.0 0.0 - 0.7 K/uL   Basophils Absolute 0.0 0.0 - 0.1 K/uL   Smear Review MORPHOLOGY UNREMARKABLE   Acetaminophen level     Status: Abnormal   Collection Time: 10/05/15  7:02 AM  Result Value  Ref Range   Acetaminophen (Tylenol), Serum <10 (L) 10 - 30 ug/mL    Comment:        THERAPEUTIC CONCENTRATIONS VARY SIGNIFICANTLY. A RANGE OF 10-30 ug/mL MAY BE AN EFFECTIVE CONCENTRATION FOR MANY PATIENTS. HOWEVER, SOME ARE BEST TREATED AT CONCENTRATIONS OUTSIDE THIS RANGE. ACETAMINOPHEN CONCENTRATIONS >150 ug/mL AT 4 HOURS AFTER INGESTION AND >50 ug/mL AT 12 HOURS AFTER INGESTION ARE OFTEN ASSOCIATED WITH TOXIC REACTIONS.   Salicylate level     Status: None   Collection Time: 10/05/15  7:02 AM  Result Value Ref Range   Salicylate Lvl <3.2 2.8 - 30.0 mg/dL  Urine rapid drug screen (hosp performed)not at [facility]     Status: Abnormal   Collection Time: 10/05/15 10:40 AM  Result Value Ref Range   Opiates NONE DETECTED NONE DETECTED   Cocaine POSITIVE (A) NONE DETECTED   Benzodiazepines NONE DETECTED NONE DETECTED   Amphetamines NONE DETECTED NONE DETECTED   Tetrahydrocannabinol NONE DETECTED NONE DETECTED   Barbiturates NONE DETECTED NONE DETECTED    Comment:        DRUG SCREEN FOR MEDICAL PURPOSES ONLY.  IF CONFIRMATION IS NEEDED FOR ANY PURPOSE, NOTIFY LAB WITHIN 5 DAYS.        LOWEST DETECTABLE LIMITS FOR URINE DRUG SCREEN Drug Class       Cutoff (ng/mL) Amphetamine      1000 Barbiturate      200 Benzodiazepine   440 Tricyclics       102 Opiates          300 Cocaine          300 THC              50     Metabolic Disorder Labs:  No results  found for: HGBA1C, MPG No results found for: PROLACTIN Lab Results  Component Value Date   CHOL 216* 12/05/2014   TRIG 71 12/05/2014   HDL 55 12/05/2014   CHOLHDL 3.9 12/05/2014   VLDL 14 12/05/2014   LDLCALC 147* 12/05/2014    Current Medications: Current Facility-Administered Medications  Medication Dose Route Frequency Provider Last Rate Last Dose  . acetaminophen (TYLENOL) tablet 650 mg  650 mg Oral Q6H PRN Patrecia Pour, NP      . alum & mag hydroxide-simeth (MAALOX/MYLANTA) 200-200-20 MG/5ML suspension 30 mL  30 mL Oral Q4H PRN Patrecia Pour, NP      . amLODipine (NORVASC) tablet 10 mg  10 mg Oral Daily Patrecia Pour, NP   10 mg at 10/06/15 0747  . aspirin EC tablet 81 mg  81 mg Oral Daily Patrecia Pour, NP   81 mg at 10/06/15 0747  . hydrOXYzine (ATARAX/VISTARIL) tablet 25 mg  25 mg Oral Q6H PRN Ursula Alert, MD   25 mg at 10/05/15 2106  . levothyroxine (SYNTHROID, LEVOTHROID) tablet 25 mcg  25 mcg Oral QAC breakfast Patrecia Pour, NP   25 mcg at 10/06/15 0602  . magnesium hydroxide (MILK OF MAGNESIA) suspension 30 mL  30 mL Oral Daily PRN Patrecia Pour, NP      . sertraline (ZOLOFT) tablet 25 mg  25 mg Oral Daily Derrill Center, NP   25 mg at 10/06/15 1249  . traZODone (DESYREL) tablet 50 mg  50 mg Oral QHS PRN Ursula Alert, MD   50 mg at 10/05/15 2106   PTA Medications: Prescriptions prior to admission  Medication Sig Dispense Refill Last Dose  . amLODipine (NORVASC) 10 MG tablet Take  1 tablet (10 mg total) by mouth daily. 30 tablet 4 a week  . amoxicillin-clavulanate (AUGMENTIN) 875-125 MG tablet Take 1 tablet by mouth every 12 (twelve) hours. (Patient not taking: Reported on 10/05/2015) 14 tablet 0   . aspirin EC 81 MG tablet Take 1 tablet (81 mg total) by mouth daily. 60 tablet 5 a week  . Levothyroxine Sodium 25 MCG CAPS Take 1 capsule (25 mcg total) by mouth daily before breakfast. 30 capsule 4 a week  . losartan (COZAAR) 50 MG tablet Take 1 tablet (50 mg  total) by mouth daily. 30 tablet 4 a week    Musculoskeletal: Strength & Muscle Tone: within normal limits Gait & Station: normal Patient leans: N/A  Psychiatric Specialty Exam: Physical Exam  Nursing note and vitals reviewed. Constitutional: He is oriented to person, place, and time. He appears well-developed.  Neck: Neck supple.  Cardiovascular: Normal rate.   Respiratory: Breath sounds normal.  Musculoskeletal: Normal range of motion.  Neurological: He is alert and oriented to person, place, and time.  Skin: Skin is warm and dry.  Psychiatric: He has a normal mood and affect. His behavior is normal.    Review of Systems  Respiratory: Negative.   Cardiovascular: Negative.   Neurological: Positive for headaches.  Psychiatric/Behavioral: Positive for depression, suicidal ideas, hallucinations and substance abuse. The patient is nervous/anxious.   All other systems reviewed and are negative.   Blood pressure 124/66, pulse 68, temperature 98.1 F (36.7 C), temperature source Oral, resp. rate 16, height '5\' 9"'$  (1.753 m), weight 85.276 kg (188 lb), SpO2 100 %.Body mass index is 27.75 kg/(m^2).  General Appearance: Casual  Eye Contact::  Fair  Speech:  Garbled and Pressured  Volume:  Decreased  Mood:  Angry, Depressed and Irritable  Affect:  Depressed and Flat  Thought Process:  Linear and Logical  Orientation:  Full (Time, Place, and Person)  Thought Content:  Hallucinations: Visual  Suicidal Thoughts:  Yes.  without intent/plan  Homicidal Thoughts:  No  Memory:  Immediate;   Fair Recent;   Fair Remote;   Fair  Judgement:  Poor  Insight:  Lacking  Psychomotor Activity:  Restlessness  Concentration:  Fair  Recall:  Poor  Fund of Knowledge:Fair  Language: Fair  Akathisia:  No  Handed:  Right  AIMS (if indicated):     Assets:  Desire for Improvement Social Support  ADL's:  Intact  Cognition: WNL  Sleep:        Treatment Plan Summary: Daily contact with patient to  assess and evaluate symptoms and progress in treatment and Medication management   Start Zoloft 25 mg Po for  mood stabilization. With a plan to titrate up to 100 mg daily  Continue with Trazodone 100 mg for insomnia Started on CWIA/ Librium Protocol Will continue to monitor vitals ,medication compliance and treatment side effects while patient is here.  Reviewed labs Glucose WNL elevated ,BAL - , UDS - pos for cocaine BUN 22 (H), Creatinine  1.44 (H), AST 43 (H), Platelets 116 (L)  Recheck Potassium level 10/06/2015 40 Meq was given in ED on 10/05/2015 CSW will start working on disposition.  Patient to participate in therapeutic milieu  Observation Level/Precautions:  15 minute checks  Laboratory:  CBC Chemistry Profile UDS UA + Cocaine  Psychotherapy:  Indivdual and group session  Medications:  Zoloft 100 mg, Trazodone 50 mg  Consultations:  Psychiatry   Discharge Concerns:  Safety, stabilization, and risk of access to medication and medication  stabilization   Estimated LOS: 5-7 days  Other:     I certify that inpatient services furnished can reasonably be expected to improve the patient's condition.   Derrill Center  FNP- BC  12/25/20163:41 PM

## 2015-10-06 NOTE — [facility] Suicide Risk Assessment (Signed)
[facility]   Nursing information obtained from:    Demographic factors:    Current Mental Status:    Loss Factors:    Historical Factors:    Risk Reduction Factors:    Total Time spent with patient: 30 minutes Principal Problem: Substance or medication-induced anxiety disorder (HCC) COCAINE  Diagnosis:   Patient Active Problem List   Diagnosis Date Noted  . Cocaine use disorder, severe, dependence (HCC) [F14.20] 10/06/2015  . 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 Substance or medication-induced anxiety disorder (HCC) [F19.980] 10/06/2015  . Suicidal ideation [R45.851]   . Unintended weight loss [R63.4] 04/09/2015  . HTN (hypertension) [I10] 11/21/2014  . History of stroke [Z86.73] 11/21/2014  . History of thyroid disease [Z86.39] 11/21/2014     Continued Clinical Symptoms:  Alcohol Use Disorder Identification Test Final Score (AUDIT): 0 The "Alcohol Use Disorders Identification Test", Guidelines for Use in Primary Care, Second Edition.  World Science writerHealth Organization [facility](WHO). Score between 0-7:  no or low risk or alcohol related problems. Score between 8-15:  moderate risk of alcohol related problems. Score between 16-19:  high risk of alcohol related problems. Score 20 or above:  warrants further diagnostic evaluation for alcohol dependence and treatment.   CLINICAL FACTORS:   Bipolar Disorder:   Mixed State Alcohol/Substance Abuse/Dependencies   Musculoskeletal: Strength & Muscle Tone: within normal limits Gait & Station: normal Patient leans: N/A  Psychiatric Specialty Exam: Physical Exam  Review of Systems  Psychiatric/Behavioral: Positive for depression, hallucinations and substance abuse. The patient is nervous/anxious and has insomnia.   All other systems reviewed and are negative.   Blood pressure 124/66, pulse 68, temperature 98.1 F (36.7 C), temperature source  Oral, resp. rate 16, height 5\' 9"  (1.753 m), weight 85.276 kg (188 lb), SpO2 100 %.Body mass index is 27.75 kg/(m^2).  General Appearance: Fairly Groomed  Patent attorneyye Contact::  Fair  Speech:  Clear and Coherent  Volume:  Normal  Mood:  Anxious and Depressed  Affect:  Appropriate  Thought Process:  Coherent  Orientation:  Full (Time, Place, and Person)  Thought Content:  Hallucinations: Auditory  Suicidal Thoughts:  No  Homicidal Thoughts:  No  Memory:  Immediate;   Fair Recent;   Fair Remote;   Fair  Judgement:  Impaired  Insight:  Fair  Psychomotor Activity:  Normal  Concentration:  Fair  Recall:  FiservFair  Fund of Knowledge:Fair  Language: Fair  Akathisia:  No  Handed:  Right  AIMS (if indicated):     Assets:  Communication Skills Desire for Improvement  Sleep:     Cognition: WNL  ADL's:  Intact     COGNITIVE FEATURES THAT CONTRIBUTE TO RISK:  Closed-mindedness, Polarized thinking and Thought constriction (tunnel vision)    SUICIDE RISK:   Moderate:  Frequent suicidal ideation with limited intensity, and duration, some specificity in terms of plans, no associated intent, good self-control, limited dysphoria/symptomatology, some risk factors present, and identifiable protective factors, including available and accessible social support.  PLAN OF CARE: Patient will benefit from inpatient treatment and stabilization.  Estimated length of stay is 5-7 days.  Reviewed past medical records,treatment plan.  Reviewed plan with NP - please also see H&p. Will continue to monitor vitals ,medication compliance and treatment side effects while patient is here.  Will monitor for medical issues as well as call consult as needed.  Reviewed labs ,will order repeat K+ level as well as TSH.  CSW will start working on disposition.  Patient to participate in therapeutic milieu .       Medical Decision Making:  Review of Psycho-Social Stressors (1), Review or order clinical lab tests (1),  Discuss test with performing physician (1), Established Problem, Worsening (2), Review of Last Therapy Session (1), Review or order medicine tests (1) and Review of New Medication or Change in Dosage (2)  I certify that inpatient services furnished can reasonably be expected to improve the patient's condition.   Chino Sardo MD 10/06/2015, 1:36 PM

## 2015-10-06 NOTE — Progress Notes (Signed)
D: Patient has been up in the milieu.  He is pleasant upon approach.  He reports minimal withdrawal symptoms.  He rates his depression, hopelessness and anxiety as a 5. He presents with flat, blunted affect; his mood is sad and depressed.  He has been interacting well with staff and his peers.   A: Continue to monitor medication management and MD orders.  Safety checks completed every 15 minutes per protocol.  Offer support and encouragement as needed.  Educated patient on new medication for depression. R: Patient is receptive to staff; his behavior is appropriate.

## 2015-10-07 MED ORDER — TRAZODONE HCL 150 MG PO TABS
150.0000 mg | ORAL_TABLET | Freq: Every evening | ORAL | Status: DC | PRN
  Administered 2015-10-08: 150 mg via ORAL
  Filled 2015-10-07: qty 1
  Filled 2015-10-07: qty 7

## 2015-10-07 MED ORDER — SERTRALINE HCL 50 MG PO TABS
50.0000 mg | ORAL_TABLET | Freq: Every day | ORAL | Status: DC
  Administered 2015-10-08 – 2015-10-09 (×2): 50 mg via ORAL
  Filled 2015-10-07 (×2): qty 1
  Filled 2015-10-07: qty 7
  Filled 2015-10-07: qty 1

## 2015-10-07 NOTE — Progress Notes (Signed)
[facility] MD Progress Note  10/07/2015 4:28 PM Gene Garcia  MRN:  [id number]  Subjective: Gene Garcia reports, "I'm still feeling very depressed & suicidal. My depression is due to loneliness. I feel very tired sometimes".  Objective: [name] is visible on the unit. He is participating in group sessions. He says he is feeling depressed & suicidal. Denies any intent or plans to hurt himself & or others. Complains of loneliness.  Principal Problem: Substance or medication-induced anxiety disorder (HCC)  Diagnosis:   Patient Active Problem List   Diagnosis Date Noted  . Cocaine use disorder, severe, dependence (HCC) [F14.20] 10/06/2015  . 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 Substance or medication-induced anxiety disorder (HCC) [F19.980] 10/06/2015  . Hypokalemia [E87.6] 10/06/2015  . Suicidal ideation [R45.851]   . Unintended weight loss [R63.4] 04/09/2015  . HTN (hypertension) [I10] 11/21/2014  . History of stroke [Z86.73] 11/21/2014  . History of thyroid disease [Z86.39] 11/21/2014   Total Time spent with patient: 25 minutes  Past Psychiatric History: See H&B  Past Medical History:  Past Medical History  Diagnosis Date  . Thyroid disease   . Hypertension   . Asthma    History reviewed. No pertinent past surgical history. Family History:  Family History  Problem Relation Age of Onset  . Diabetes Maternal Aunt    Family Psychiatric  History: See H&P Social History:  History  Alcohol Use No     History  Drug Use  . Yes  . Special: "Crack" cocaine    Social History   Social History  . Marital Status: Single    Spouse Name: N/A  . Number of Children: N/A  . Years of Education: N/A   Social History Main Topics  . Smoking status: Never Smoker   . Smokeless tobacco: None  . Alcohol Use: No  . Drug Use: Yes    Special: "Crack" cocaine  . Sexual Activity: Not Asked   Other Topics Concern  . None    Social History Narrative   Additional Social History:   Sleep: "Poor"  Appetite:  Fair  Current Medications: Current Facility-Administered Medications  Medication Dose Route Frequency Provider Last Rate Last Dose  . acetaminophen (TYLENOL) tablet 650 mg  650 mg Oral Q6H PRN Charm Rings, NP      . alum & mag hydroxide-simeth (MAALOX/MYLANTA) 200-200-20 MG/5ML suspension 30 mL  30 mL Oral Q4H PRN Charm Rings, NP      . amLODipine (NORVASC) tablet 10 mg  10 mg Oral Daily Charm Rings, NP   10 mg at 10/07/15 0839  . aspirin EC tablet 81 mg  81 mg Oral Daily Charm Rings, NP   81 mg at 10/07/15 4098  . hydrOXYzine (ATARAX/VISTARIL) tablet 25 mg  25 mg Oral Q6H PRN Jomarie Longs, MD   25 mg at 10/05/15 2106  . levothyroxine (SYNTHROID, LEVOTHROID) tablet 25 mcg  25 mcg Oral QAC breakfast Charm Rings, NP   25 mcg at 10/07/15 1191  . magnesium hydroxide (MILK OF MAGNESIA) suspension 30 mL  30 mL Oral Daily PRN Charm Rings, NP      . Melene Muller ON 10/08/2015] sertraline (ZOLOFT) tablet 50 mg  50 mg Oral Daily Sanjuana Kava, NP      . traZODone (DESYREL) tablet 150 mg  150 mg Oral QHS PRN Sanjuana Kava, NP        Lab Results:  Results for orders placed or performed  during the hospital encounter of 10/05/15 (from the past 48 hour(s))  Potassium     Status: Abnormal   Collection Time: 10/06/15  6:30 PM  Result Value Ref Range   Potassium 3.3 (L) 3.5 - 5.1 mmol/L    Comment: Performed at [facility]  TSH     Status: Abnormal   Collection Time: 10/06/15  6:30 PM  Result Value Ref Range   TSH 8.705 (H) 0.350 - 4.500 uIU/mL    Comment: [facility]    Physical Findings: AIMS: Facial and Oral Movements Muscles of Facial Expression: None, normal Lips and Perioral Area: None, normal Jaw: None, normal Tongue: None, normal,Extremity Movements Upper (arms, wrists, hands, fingers): None, normal Lower (legs, knees, ankles, toes):  None, normal, Trunk Movements Neck, shoulders, hips: None, normal, Overall Severity Severity of abnormal movements (highest score from questions above): None, normal Incapacitation due to abnormal movements: None, normal Patient's awareness of abnormal movements (rate only patient's report): No Awareness, Dental Status Current problems with teeth and/or dentures?: No Does patient usually wear dentures?: No  CIWA:  CIWA-Ar Total: 0 COWS:     Musculoskeletal: Strength & Muscle Tone: within normal limits Gait & Station: normal Patient leans: N/A  Psychiatric Specialty Exam: Review of Systems  Constitutional: Negative.   HENT: Negative.   Eyes: Negative.   Respiratory: Negative.   Cardiovascular: Negative.   Gastrointestinal: Negative.   Genitourinary: Negative.   Musculoskeletal: Negative.   Skin: Negative.   Neurological: Negative.   Endo/Heme/Allergies: Negative.   Psychiatric/Behavioral: Positive for depression, suicidal ideas (Denies any plans or intent) and substance abuse (Cocaine dependence). Negative for hallucinations and memory loss. The patient is nervous/anxious and has insomnia.     Blood pressure 160/91, pulse 69, temperature 98.1 F (36.7 C), temperature source Oral, resp. rate 20, height 5\' 9"  (1.753 m), weight 85.276 kg (188 lb), SpO2 100 %.Body mass index is 27.75 kg/(m^2).   General Appearance: Casual  Eye Contact:: Fair  Speech:Clear  Volume: Decreased  Mood: Still endorsing worsening depression  Affect:Flat& restricted  Thought Process: Intact  Orientation: Full (Time, Place, and Person)  Thought Content:Denies any hallucinations  Suicidal Thoughts: Yes, without intent/plan  Homicidal Thoughts: No  Memory: Immediate; Fair Recent; Fair Remote; Fair  Judgement: Poor  Insight: Lacking  Psychomotor Activity: Decreased  Concentration:Fair  Recall: Poor  Fund of Knowledge: Fair  Language: Fair  Akathisia: No   Handed: Right  AIMS (if indicated):    Assets: Desire for Improvement Social Support  ADL's: Intact  Cognition: WNL  Sleep:         Treatment Plan Summary: 1. Continue crisis management, mood stabilization & relapse prevention.. 2. Continue current medication management to reduce current symptoms to base line and improve the  patient's overall level of functioning; Sertraline 50 mg for depression, Hydroxyzine 25 mg,Trazodone 150 mg q hs 3. Treat health problems as indicated, Norvasc 10 mg for HTN, Synthroid  25 mcg. 4. Develop treatment plan to enhance medication adeherance upon discharge & prevent the need for  readmission. 5. Psycho-social education regarding relapse prevention and self care.   Sanjuana KavaNwoko, Agnes I, PMHNP, FNP-BC 10/07/2015, 4:28 PM Agree with NP progress note as above Nehemiah MassedFernando Cobos, MD

## 2015-10-07 NOTE — Tx Team (Signed)
  Interdisciplinary Treatment Plan Update (Adult)  Date:  ,.10/09/2015  Time Reviewed:  8:22 AM   Progress in Treatment: Attending groups: No.  Participating in groups:  No. Taking medication as prescribed:  Yes. Tolerating medication:  Yes. Family/Significant othe contact made:  SPE completed with pt, as hre refused to consent to family contact.  Patient understands diagnosis:  Yes. and As evidenced by:  seeking treatment for SI, depression, AH, and for medication stabilization. Discussing patient identified problems/goals with staff:  Yes. Medical problems stabilized or resolved:  Yes. Denies suicidal/homicidal ideation: yes, During self report.  Issues/concerns per patient self-inventory:  Other:  Discharge Plan or Barriers: Pt plans to return to [facility] house and will follow-up at [facility]. Pt given AA/NA list for [place] information. Pt also given list of [place] houses, friends of bill info, and [facility] pamphlet.   Reason for Continuation of Hospitalization: none  Comments:  Gene Garcia was admitted to room 301-1 from [facility] ED where he presented with SI with no plan, increasing depression, auditory hallucinations and being homeless. He admitted that he has been out of his psychiatric medications for over a week and he is unable to tell writer what medications that he takes. He has history of hypertension and hypothyroidism in which he has been off those medications as well. He endorses suicidal ideation with no plan. He denies hearing voices at this time but state that he cannot understand what they are telling him. He gets his OP services [facility]   Estimated length of stay:  D/c today    Additional Comments:  Patient and CSW reviewed pt's identified goals and treatment plan. Patient verbalized understanding and agreed to treatment plan. CSW reviewed [facility] "Discharge Process and Patient Involvement" Form. Pt verbalized understanding of information  provided and signed form.    Review of initial/current patient goals per problem list:  1. Goal(s): Patient will participate in aftercare plan  Met: Yes   Target date: at discharge  As evidenced by: Patient will participate within aftercare plan AEB aftercare provider and housing plan at discharge being identified.  12/28: Pt plans to return to [employer] and follow-up at [facility].   2. Goal (s): Patient will exhibit decreased depressive symptoms and suicidal ideations.  Met: Yes.    Target date: at discharge  As evidenced by: Patient will utilize self rating of depression at 3 or below and demonstrate decreased signs of depression or be deemed stable for discharge by MD.  12/28: Pt rates depression as low and presents with pleasant mood/calm affect. Denies SI/HI/AVH.   Attendees: Patient:     Family:     Physician:  Dr. Carlton Adam, MD 10/09/2015 10:24 AM   Nursing:   Rutherford Guys RN 10/09/2015 10:24 AM   Clinical Social Worker: Maxie Better, LCSW 10/09/2015 10:24 AM   Clinical Social Worker: Erasmo Downer Drinkard LCSWA; Peri Maris Brigid Re LCSW 10/09/2015 10:24 AM   Other:  Agustina Caroli NP  10/09/2015 10:24 AM   Other:     Other:     Other:    Other:    Other:                 Scribe for Treatment Team:   Maxie Better, LCSW 10/09/2015 10:25 AM

## 2015-10-07 NOTE — Progress Notes (Signed)
DAR NOTE: Patient remained isolative with minimal interaction with staff and other patients.  Mood and affect remained depressed. Denies pain, auditory and visual hallucinations.  Rates depression at 5, hopelessness at 5, and anxiety at 5.  Reports withdrawal symptoms of chilling and runny nose on self inventory form.  Reports suicidal thoughts but contracts for safety.  Maintained on routine safety checks.  Medications given as prescribed.  Support and encouragement offered as needed.

## 2015-10-07 NOTE — [facility] Group Notes (Signed)
[facility] Note   10/07/2015 11:37 AM  Participation Quality:  Pt invited. Chose to remain in bed.   Smart, Chrisette Man LCSW

## 2015-10-07 NOTE — Progress Notes (Signed)
Recreation Therapy Notes  Date: 12.26.2016 Time: 9:30am  Location: [address] Dayroom   Group Topic: Stress Management  Goal Area(s) Addresses:  Patient will actively participate in stress management techniques presented during session.   Behavioral Response: Did not attend.   Nikitha Mode L Sarh Kirschenbaum, LRT/CTRS         Gwynn Crossley L 10/07/2015 12:05 PM 

## 2015-10-07 NOTE — Progress Notes (Signed)
D: Patient observed on unit in milieu. Interacts with peers and staff. States his goal for today is stay sober and continue with out patient program.  A: Support and encouragement offered. Patient encouraged to attend group however declined. Q 15 minute checks maintained and in progress for safety. R: Patient safe on unit and receptive to staff. Monitoring continues.

## 2015-10-07 NOTE — B[facility] Counselor (Signed)
CSW attempted to meet with pt individually in order to complete PSA/discuss aftercare. Pt in bed. Did not want to meet with CSW today.   Trula SladeHeather Smart, MSW, LCSW Clinical Social Worker 10/07/2015 11:38 AM

## 2015-10-07 NOTE — Plan of Care (Signed)
Problem: Alteration in mood & ability to function due to Goal: STG-Patient will comply with prescribed medication regimen (Patient will comply with prescribed medication regimen)  Outcome: Progressing Patient is compliant with medication regime.     

## 2015-10-08 NOTE — [facility] Group Notes (Signed)
[facility] LCSW Group Therapy Type of Therapy: Group Therapy  Participation Level: Active  Participation Quality: Appropriate and Attentive  Affect: Appropriate  Cognitive: Alert, Appropriate and Oriented  Insight: Developing/Improving  Engagement in Therapy: Developing/Improving  Modes of Intervention: Discussion, Education, Exploration, Orientation and Support  Summary of Progress/Problems: [facility] Speaker came to talk about his personal journey with substance abuse and addiction. The pt processed ways by which to relate to the speaker. MHA speaker provided handouts and educational information pertaining to groups and services offered by the [facility].    Gene LernerLeslie M. Phelan Goers, MSW, LCSW 3:41 PM 10/08/2015

## 2015-10-08 NOTE — Progress Notes (Signed)
DAR NOTE: Patient remain isolative to his room.  Minimal interaction with staff and peers.  Mood and affect remained depressed.  Denies pain, auditory and visual hallucinations.  Rates depression at 5, hopelessness at 5, and anxiety at 5.  Patient reports suicidal thoughts but contracts for safety.  Describes energy level as high and concentration as poor.  Maintained on routine safety checks.  Medications given as prescribed.  Support and encouragement offered as needed.  Attended group but did not participate.   Patient up and in the dayroom briefly before dinner time.

## 2015-10-08 NOTE — Plan of Care (Signed)
Problem: Alteration in mood & ability to function due to Goal: STG-Patient will attend groups Outcome: Not Progressing Patient did not attend group 10/07/15

## 2015-10-08 NOTE — Progress Notes (Signed)
Patient ID: Gene Garcia, male   DOB: [birth year], 54 y.o.   MRN: [id number] [facility] MD Progress Note  10/08/2015 3:47 PM Laird Runnion  MRN:  [id number]  Subjective: Susano reports, "I feel a little better today. I got a little sleep last night. When discharged, I will be going back to the [facility] house. I'm still feeling depressed & suicidal. I just have no plans or intention to kill myself" ".  Objective: Zakaria is visible on the unit. He is participating in group sessions. He says he is feeling depressed & suicidal. Denies any intent or plans to hurt himself & or others. Complains of loneliness. Says will be moving back to the [facility] house after discharge. He is in no apparent distress.  Principal Problem: Substance or medication-induced anxiety disorder (HCC)  Diagnosis:   Patient Active Problem List   Diagnosis Date Noted  . Cocaine use disorder, severe, dependence (HCC) [F14.20] 10/06/2015  . 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 Substance or medication-induced anxiety disorder (HCC) [F19.980] 10/06/2015  . Hypokalemia [E87.6] 10/06/2015  . Suicidal ideation [R45.851]   . Unintended weight loss [R63.4] 04/09/2015  . HTN (hypertension) [I10] 11/21/2014  . History of stroke [Z86.73] 11/21/2014  . History of thyroid disease [Z86.39] 11/21/2014   Total Time spent with patient: 15 minutes  Past Psychiatric History: See H&B  Past Medical History:  Past Medical History  Diagnosis Date  . Thyroid disease   . Hypertension   . Asthma    History reviewed. No pertinent past surgical history. Family History:  Family History  Problem Relation Age of Onset  . Diabetes Maternal Aunt    Family Psychiatric  History: See H&P  Social History:  History  Alcohol Use No     History  Drug Use  . Yes  . Special: "Crack" cocaine    Social History   Social History  . Marital Status: Single    Spouse Name: N/A  .  Number of Children: N/A  . Years of Education: N/A   Social History Main Topics  . Smoking status: Never Smoker   . Smokeless tobacco: None  . Alcohol Use: No  . Drug Use: Yes    Special: "Crack" cocaine  . Sexual Activity: Not Asked   Other Topics Concern  . None   Social History Narrative   Additional Social History:   Sleep: Good  Appetite:  Fair  Current Medications: Current Facility-Administered Medications  Medication Dose Route Frequency Provider Last Rate Last Dose  . acetaminophen (TYLENOL) tablet 650 mg  650 mg Oral Q6H PRN Charm Rings, NP      . alum & mag hydroxide-simeth (MAALOX/MYLANTA) 200-200-20 MG/5ML suspension 30 mL  30 mL Oral Q4H PRN Charm Rings, NP      . amLODipine (NORVASC) tablet 10 mg  10 mg Oral Daily Charm Rings, NP   10 mg at 10/08/15 0856  . aspirin EC tablet 81 mg  81 mg Oral Daily Charm Rings, NP   81 mg at 10/08/15 0855  . hydrOXYzine (ATARAX/VISTARIL) tablet 25 mg  25 mg Oral Q6H PRN Jomarie Longs, MD   25 mg at 10/05/15 2106  . levothyroxine (SYNTHROID, LEVOTHROID) tablet 25 mcg  25 mcg Oral QAC breakfast Charm Rings, NP   25 mcg at 10/08/15 [phone]  . magnesium hydroxide (MILK OF MAGNESIA) suspension 30 mL  30 mL Oral Daily PRN Charm Rings, NP      .  sertraline (ZOLOFT) tablet 50 mg  50 mg Oral Daily Sanjuana KavaAgnes I Nwoko, NP   50 mg at 10/08/15 0856  . traZODone (DESYREL) tablet 150 mg  150 mg Oral QHS PRN Sanjuana KavaAgnes I Nwoko, NP       Lab Results:  Results for orders placed or performed during the hospital encounter of 10/05/15 (from the past 48 hour(s))  Potassium     Status: Abnormal   Collection Time: 10/06/15  6:30 PM  Result Value Ref Range   Potassium 3.3 (L) 3.5 - 5.1 mmol/L    Comment: Performed at [facility]  TSH     Status: Abnormal   Collection Time: 10/06/15  6:30 PM  Result Value Ref Range   TSH 8.705 (H) 0.350 - 4.500 uIU/mL    Comment: Performed at [facility]   Physical  Findings: AIMS: Facial and Oral Movements Muscles of Facial Expression: None, normal Lips and Perioral Area: None, normal Jaw: None, normal Tongue: None, normal,Extremity Movements Upper (arms, wrists, hands, fingers): None, normal Lower (legs, knees, ankles, toes): None, normal, Trunk Movements Neck, shoulders, hips: None, normal, Overall Severity Severity of abnormal movements (highest score from questions above): None, normal Incapacitation due to abnormal movements: None, normal Patient's awareness of abnormal movements (rate only patient's report): No Awareness, Dental Status Current problems with teeth and/or dentures?: No Does patient usually wear dentures?: No  CIWA:  CIWA-Ar Total: 0 COWS:     Musculoskeletal: Strength & Muscle Tone: within normal limits Gait & Station: normal Patient leans: N/A  Psychiatric Specialty Exam: Review of Systems  Constitutional: Negative.   HENT: Negative.   Eyes: Negative.   Respiratory: Negative.   Cardiovascular: Negative.   Gastrointestinal: Negative.   Genitourinary: Negative.   Musculoskeletal: Negative.   Skin: Negative.   Neurological: Negative.   Endo/Heme/Allergies: Negative.   Psychiatric/Behavioral: Positive for depression, suicidal ideas (Denies any plans or intent) and substance abuse (Cocaine dependence). Negative for hallucinations and memory loss. The patient is nervous/anxious and has insomnia.     Blood pressure 152/98, pulse 73, temperature 97.6 F (36.4 C), temperature source Oral, resp. rate 20, height 5\' 9"  (1.753 m), weight 85.276 kg (188 lb), SpO2 100 %.Body mass index is 27.75 kg/(m^2).   General Appearance: Casual  Eye Contact:: Fair  Speech:Clear  Volume: Decreased  Mood: Still endorsing worsening depression  Affect:Flat& restricted  Thought Process: Intact  Orientation: Full (Time, Place, and Person)  Thought Content:Denies any hallucinations  Suicidal Thoughts: Yes, without intent/plan   Homicidal Thoughts: No  Memory: Immediate; Fair Recent; Fair Remote; Fair  Judgement: Poor  Insight: Lacking  Psychomotor Activity: Decreased  Concentration:Fair  Recall: Poor  Fund of Knowledge: Fair  Language: Fair  Akathisia: No  Handed: Right  AIMS (if indicated):    Assets: Desire for Improvement Social Support  ADL's: Intact  Cognition: WNL  Sleep:6.75       Treatment Plan Summary: 1. Continue crisis management, mood stabilization & relapse prevention.. 2. Continue current medication management to reduce current symptoms to base line and improve the  patient's overall level of functioning; Sertraline 50 mg for depression, Hydroxyzine 25 mg,Trazodone 150 mg q hs 3. Treat health problems as indicated, Norvasc 10 mg for HTN, Synthroid  25 mcg. 4. Develop treatment plan to enhance medication adeherance upon discharge & prevent the need for  readmission. 5. Psycho-social education regarding relapse prevention and self care. 6. No changes made, continue current plan of care.  Sanjuana KavaNwoko, Agnes I, PMHNP, FNP-BC  10/08/2015, 3:47 PM Agree with NP progress note as above Nehemiah Massed, MD

## 2015-10-08 NOTE — Progress Notes (Signed)
D: Patient alert and oriented x 4. Patient denies pain/SI/HI/AVH. Patient did not attend group. Patient denies any withdraw symptoms.  A: Staff to monitor Q 15 mins for safety. Encouragement and support offered. Scheduled medications administered per orders. R: Patient remains safe on the unit. Patient visible on the unit. Patient taking administered medications.

## 2015-10-08 NOTE — BHH Counselor (Signed)
Adult Comprehensive Assessment  Patient ID: Gene Garcia, male   DOB: 05-Jun-1961, 54 y.o.   MRN: [id number]  Information Source: Information source: Patient  Current Stressors:  Educational / Learning stressors: None Employment / Job issues: None Family Relationships: All of family is in [place] [employer] / Lack of resources (include bankruptcy): Patient reports stress from not having a job and no income.  Housing / Lack of housing: Currently homeless. [facility] on [address] Physical health (include injuries & life threatening diseases): HTN Social relationships: No stressors  Substance abuse: Crack Cocaine  Bereavement / Loss: Patient states everybody has died in his family. "Im the only one left."   Living/Environment/Situation:  Living Arrangements: Other (Comment) Living conditions (as described by patient or guardian): [facility] How long has patient lived in current situation?: 2 years  What is atmosphere in current home: Supportive, Loving  Family History:  Marital status: Single Are you sexually active?: No What is your sexual orientation?: Heterosexual  Has your sexual activity been affected by drugs, alcohol, medication, or emotional stress?: Yes Does patient have children?: Yes How many children?: 2 How is patient's relationship with their children?: Patient has 2 adult children. Has not seen them in years. They currently live in [place].   Childhood History:  By whom was/is the patient raised?: Both parents Description of patient's relationship with caregiver when they were a child: Patient reports an "okay" relationship with his parents as a child. Patient's description of current relationship with people who raised him/her: Parents are currently deceased. How were you disciplined when you got in trouble as a child/adolescent?: Mother.  Does patient have siblings?: Yes Number of Siblings: 3 Description of patient's  current relationship with siblings: 2 brothers and 1 sister. "They have all passed away per patient." Did patient suffer any verbal/emotional/physical/sexual abuse as a child?: No Did patient suffer from severe childhood neglect?: No Has patient ever been sexually abused/assaulted/raped as an adolescent or adult?: No Was the patient ever a victim of a crime or a disaster?: No Witnessed domestic violence?: No Has patient been effected by domestic violence as an adult?: No  Education:  Highest grade of school patient has completed: 10th Currently a student?: No Learning disability?: No  Employment/Work Situation:   Employment situation: Unemployed Patient's job has been impacted by current illness: No What is the longest time patient has a held a job?: "I don't know. 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." Where was the patient employed at that time?: N/A Has patient ever been in the [employer] and Companymilitary?: No Has patient ever served in combat?: No Did You Receive Any Psychiatric Treatment/Services While in [employer] Military?: No Are There Guns or Other Weapons in Your Home?: No  Financial Resources:   Financial resources: No income  Alcohol/Substance Abuse:   What has been your use of drugs/alcohol within the last 12 months?: Crack Cocaine If attempted suicide, did drugs/alcohol play a role in this?: Yes Alcohol/Substance Abuse Treatment Hx: Denies past history If yes, describe treatment: Just [facility] per patient Has alcohol/substance abuse ever caused legal problems?: No  Social Support System:   Conservation officer, natureatient's Community Support System: Fair Museum/gallery exhibitions officerDescribe Community Support System: People from [facility] How does patient's faith help to cope with current illness?: "We would read the Bible and discuss what God can do in our lives. It was a religious program to help us" per patient.   Leisure/Recreation:   Leisure and Hobbies: None   Strengths/Needs:  What things does the patient do well?: None In what  areas does patient struggle / problems for patient: "I need to work on my reading. Can't read that well."   Discharge Plan:   Does patient have access to transportation?: Yes Will patient be returning to same living situation after discharge?: No Plan for living situation after discharge: Patient is choosing not to go back to [facility]. "I could go back but Im not sure if i want to."  Currently receiving community mental health services: No If no, would patient like referral for services when discharged?: Yes (What county?) Does patient have financial barriers related to discharge medications?: Yes Patient description of barriers related to discharge medications: No income  Summary/Recommendations:   Summary and Recommendations (to be completed by the evaluator): Patient is a 54 year old African American male that presents to [facility] with depressive symptoms, substance use, and suicidal ideation. Patient states that he was previously living at [facility] off [address] in [place] but is indecisive in regard to returning their upon discharge for unspecified reasons. Patient reports that he is currently using crack cocaine and desires SA treatment to assist with his presenting problems. Patient reports that he will need transportation assistance upon discharge.   Gene Garcia, Gene Gene Garcia. 10/08/2015

## 2015-10-08 NOTE — Progress Notes (Signed)
Patient did not attend AA group. 

## 2015-10-09 MED ORDER — ASPIRIN EC 81 MG PO TBEC: 81.0000 mg | DELAYED_RELEASE_TABLET | Freq: Every day | ORAL | Status: AC

## 2015-10-09 MED ORDER — HYDROXYZINE HCL 25 MG PO TABS: 25.0000 mg | ORAL_TABLET | Freq: Four times a day (QID) | ORAL | Status: DC | PRN

## 2015-10-09 MED ORDER — LEVOTHYROXINE SODIUM 25 MCG PO TABS: 25.0000 ug | ORAL_TABLET | Freq: Every day | ORAL | Status: DC

## 2015-10-09 MED ORDER — TRAZODONE HCL 150 MG PO TABS
150.0000 mg | ORAL_TABLET | Freq: Every evening | ORAL | Status: DC | PRN
Start: 2015-10-09 — End: 2016-06-01

## 2015-10-09 MED ORDER — SERTRALINE HCL 50 MG PO TABS: 50.0000 mg | ORAL_TABLET | Freq: Every day | ORAL | Status: DC

## 2015-10-09 MED ORDER — AMLODIPINE BESYLATE 10 MG PO TABS: 10.0000 mg | ORAL_TABLET | Freq: Every day | ORAL | Status: DC

## 2015-10-09 NOTE — Progress Notes (Signed)
Patient ID: Gene Garcia, male   DOB: November 11, 1960, 54 y.o.   MRN: [id number] NSG D/C Note:Pt denies si/hi at this time. States that he will comply with outpt services and take his meds as prescribed. D/C to lobby to friend providing transport.

## 2015-10-09 NOTE — Discharge Summary (Signed)
Physician Discharge Summary Note  Patient:  Gene Garcia is an 54 y.o., male MRN:  [id number] DOB:  03-06-1961 Patient phone:  [phone] (home)  Patient address:   [address] [place] [address],  Total Time spent with patient: Greater than 30 minutes  Date of Admission:  10/05/2015  Date of Discharge: 10-09-15  Reason for Admission:  Substance induced mood disorder  Principal Problem: Substance or medication-induced anxiety disorder [facility]) Discharge Diagnoses: Patient Active Problem List   Diagnosis Date Noted  . Cocaine use disorder, severe, dependence (HCC) [F14.20] 10/06/2015  . 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 Substance or medication-induced anxiety disorder (HCC) [F19.980] 10/06/2015  . Hypokalemia [E87.6] 10/06/2015  . Suicidal ideation [R45.851]   . Unintended weight loss [R63.4] 04/09/2015  . HTN (hypertension) [I10] 11/21/2014  . History of stroke [Z86.73] 11/21/2014  . History of thyroid disease [Z86.39] 11/21/2014   Past Psychiatric History: Cocaine dependence  Past Medical History:  Past Medical History  Diagnosis Date  . Thyroid disease   . Hypertension   . Asthma    History reviewed. No pertinent past surgical history. Family History:  Family History  Problem Relation Age of Onset  . Diabetes Maternal Aunt    Family Psychiatric  History: See H&P  Social History:  History  Alcohol Use No     History  Drug Use  . Yes  . Special: "Crack" cocaine    Social History   Social History  . Marital Status: Single    Spouse Name: N/A  . Number of Children: N/A  . Years of Education: N/A   Social History Main Topics  . Smoking status: Never Smoker   . Smokeless tobacco: None  . Alcohol Use: No  . Drug Use: Yes    Special: "Crack" cocaine  . Sexual Activity: Not Asked   Other Topics Concern  . None   Social History Narrative   Hospital Course: Gene Garcia  was admitted to room 301-1 from [facility] ED where he presented with SI with no plan, increasing depression, auditory hallucinations and being homeless. He admitted that he has been out of his psychiatric medications for over a week and he is unable to tell writer what medications that he takes. He has history of hypertension and hypothyroidism in which he has been off those medications as well. He endorses suicidal ideation with no plan. He denies hearing voices at this time but state that he cannot understand what they are telling him. He gets his OP services from [place].  Upon his arrival & admision to the adult unit, Gene Garcia was evaluated & his presenting symptoms identified. His lab results showed a UDS test results positive for Cocaine. However, he was not presenting with any substance withdrawal symptoms. He did not receive any detoxification treatments as cocaine has no established detox protocols. Gene Garcia was medicated & discharged on; Sertraline 50 mg for depression, Hydroxyzine 25 mg for anxiety & Trazodone 150 mg for insomnia. He was enrolled & encouraged to participate in the unit programming, he did & learned coping skills. He presented other significant health issues that required treatment & or monitoring. He was resumed on all of his pertinent home medications for those health issues. Gene Garcia tolerated his treatment regimen & was evaluated on daily basis by the clinical providers to assure his response to his treatment regimen.As his treatment progressed, improvement was noted as evidenced by his report of decreasing symptoms, improved sleep, mood, affect, medication tolerance &  active participation in the unit programming. He was encouraged to update his providers on his progress by daily completion of a self inventory assessment, noting mood, mental status, pain, any new symptoms, anxiety and or concerns.  Gene Garcia's symptoms responded well to his treatment regimen combined with a therapeutic and  supportive environment. He was motivated for recovery as evidenced by a positive/appropriate behavior and his interaction with the staff & fellow patients.He also worked closely with the treatment team and case manager to develop a discharge plan with appropriate goals to maintain mood stability after discharge. Coping skills, problem solving as well as relaxation therapies were also part of the unit programming.  Upon his hospital discharge, Gene Garcia was in much improved condition than upon admission.His symptoms were reported as significantly decreased or resolved completely. He adamantly denies any SI/HI,  AVH, delusional thoughts & or paranoia. He was motivated to continue taking medication with a goal of continued improvement in mental health. He will continue psychiatric care on an outpatient basis as noted below. He is provided with all the necessary information required to make this appointment without problems. He received a 7 days worth, supply samples of [facility]. [facility] personal belongings in no apparent distress. Transportation per friend/city bus. BHH assisted with bus pass.  Physical Findings:  AIMS: Facial and Oral Movements Muscles of Facial Expression: None, normal Lips and Perioral Area: None, normal Jaw: None, normal Tongue: None, normal,Extremity Movements Upper (arms, wrists, hands, fingers): None, normal Lower (legs, knees, ankles, toes): None, normal, Trunk Movements Neck, shoulders, hips: None, normal, Overall Severity Severity of abnormal movements (highest score from questions above): None, normal Incapacitation due to abnormal movements: None, normal Patient's awareness of abnormal movements (rate only patient's report): No Awareness, Dental Status Current problems with teeth and/or dentures?: No Does patient usually wear dentures?: No  CIWA:  CIWA-Ar Total: 0 COWS:     Musculoskeletal: Strength & Muscle Tone: within normal  limits Gait & Station: normal Patient leans: N/A  Psychiatric Specialty Exam: Review of Systems  Constitutional: Negative.   HENT: Negative.   Eyes: Negative.   Cardiovascular: Negative for chest pain, palpitations, orthopnea, claudication, leg swelling and PND.       Hx. HTN/cardiac issues  Gastrointestinal: Negative.   Genitourinary: Negative.   Musculoskeletal: Negative.   Skin: Negative.   Neurological: Negative.   Endo/Heme/Allergies: Negative.   Psychiatric/Behavioral: Positive for depression (Stable) and substance abuse (Cocaine dependence). Negative for suicidal ideas, hallucinations and memory loss. The patient has insomnia (Stable). The patient is not nervous/anxious.     Blood pressure 177/109, pulse 77, temperature 97.8 F (36.6 C), temperature source Oral, resp. rate 20, height 5\' 9"  (1.753 m), weight 85.276 kg (188 lb), SpO2 100 %.Body mass index is 27.75 kg/(m^2).  See Medication lists   Has this patient used any form of tobacco in the last 30 days? (Cigarettes, Smokeless Tobacco, Cigars, and/or Pipes): Yes, A prescription for an FDA-approved tobacco cessation medication was offered at discharge and the patient refused  Metabolic Disorder Labs:  No results found for: HGBA1C, MPG No results found for: PROLACTIN Lab Results  Component Value Date   CHOL 216* 12/05/2014   TRIG 71 12/05/2014   HDL 55 12/05/2014   CHOLHDL 3.9 12/05/2014   VLDL 14 12/05/2014   LDLCALC 147* 12/05/2014    See Psychiatric Specialty Exam and Suicide Risk Assessment completed by Attending Physician prior to discharge.  Discharge destination:  Home [facility]  Is patient  on multiple antipsychotic therapies at discharge:  No   Has Patient had three or more failed trials of antipsychotic monotherapy by history:  No  Recommended Plan for Multiple Antipsychotic Therapies: NA    Medication List    STOP taking these medications        losartan 50 MG tablet  Commonly known as:   COZAAR      TAKE these medications      Indication   amLODipine 10 MG tablet  Commonly known as:  NORVASC  Take 1 tablet (10 mg total) by mouth daily. For high blood pressure   Indication:  High Blood Pressure     aspirin EC 81 MG tablet  Take 1 tablet (81 mg total) by mouth daily. For heart health   Indication:  Heart health     hydrOXYzine 25 MG tablet  Commonly known as:  ATARAX/VISTARIL  Take 1 tablet (25 mg total) by mouth every 6 (six) hours as needed for anxiety.   Indication:  Anxiety     levothyroxine 25 MCG tablet  Commonly known as:  SYNTHROID, LEVOTHROID  Take 1 tablet (25 mcg total) by mouth daily before breakfast. For low functioning thyroid   Indication:  Underactive Thyroid     sertraline 50 MG tablet  Commonly known as:  ZOLOFT  Take 1 tablet (50 mg total) by mouth daily. For depression   Indication:  Major Depressive Disorder     traZODone 150 MG tablet  Commonly known as:  DESYREL  Take 1 tablet (150 mg total) by mouth at bedtime as needed for sleep.   Indication:  Trouble Sleeping       Follow-up Information    Follow up with [facility].   Why:  Walk in between 8am-9am Monday through Friday for hospital follow-up/medication management/assessment for counseling services.    Contact information:   [address], [place] [address] Phone: [phone] Fax: [phone]     Follow-up recommendations:  Activity:  As tolerated Diet: As recommended by your primary care doctor. Keep all scheduled follow-up appointments as recommended.  Comments: Take all your medications as prescribed by your mental healthcare provider. Report any adverse effects and or reactions from your medicines to your outpatient provider promptly. Patient is instructed and cautioned to not engage in alcohol and or illegal drug use while on prescription medicines. In the event of worsening symptoms, patient is instructed to call the crisis hotline, 911 and or go to the nearest  ED for appropriate evaluation and treatment of symptoms. Follow-up with your primary care provider for your other medical issues, concerns and or health care needs.  Signed: Sanjuana Kava, PMHNP, FNP-BC 10/09/2015, 11:05 AM  I personally assessed the patient and formulated the plan Madie Reno A. Dub Mikes, M.D.

## 2015-10-09 NOTE — BHH Suicide Risk Assessment (Signed)
BHH INPATIENT:  Family/Significant Other Suicide Prevention Education  Suicide Prevention Education:  Patient Refusal for Family/Significant Other Suicide Prevention Education: The patient Gene Garcia has refused to provide written consent for family/significant other to be provided Family/Significant Other Suicide Prevention Education during admission and/or prior to discharge.  Physician notified.  SPE completed with pt, as pt refused to consent to family contact. SPI pamphlet provided to pt and pt was encouraged to share information with support network, ask questions, and talk about any concerns relating to SPE. Pt denies access to guns/firearms and verbalized understanding of information provided. Mobile Crisis information also provided to pt.   Smart, Cheikh Bramble LCSW 10/09/2015, 10:19 AM

## 2015-10-09 NOTE — Progress Notes (Signed)
  [facility] Discharge Plan :  Will you be returning to the same living situation after discharge:  Yes,  [facility] At discharge, do you have transportation home?: Yes,  friend/bus pass Do you have the ability to pay for your medications: Yes,  mental health  Release of information consent forms completed and submitted to medical records by CSW.  Patient to Follow up at: Follow-up Information    Follow up with [facility].   Why:  Walk in between 8am-9am Monday through Friday for hospital follow-up/medication management/assessment for counseling services.    Contact information:   [address] [place], [place] [address] Phone: [phone] Fax: [phone]      Next level of care provider has access to [facility] Link:no  Safety Planning and Suicide Prevention discussed: Yes,  SPE completed with pt, as he refused to consent to family contact. SPI pamphlet and mobile crisis information provided to pt and he was encouraged to share this information with support network.      Has patient been referred to the Quitline?: Patient refused referral  Patient has been referred for addiction treatment: Yes-see above. Pt also give AA/NA list and Mental Health association information.Pt is returning to sover living house at discharge as well.    Smart, Rebbeca Sheperd LCSW 10/09/2015, 10:21 AM

## 2015-10-09 NOTE — BHH Suicide Risk Assessment (Signed)
[facility]   Demographic Factors:  Male  Total Time spent with patient: 20 minutes  Musculoskeletal: Strength & Muscle Tone: within normal limits Gait & Station: normal Patient leans: normal  Psychiatric Specialty Exam: Physical Exam  Review of Systems  Constitutional: Negative.   HENT: Negative.   Eyes: Negative.   Respiratory: Negative.   Cardiovascular: Negative.   Gastrointestinal: Negative.   Genitourinary: Negative.   Musculoskeletal: Negative.   Skin: Negative.   Neurological: Negative.   Endo/Heme/Allergies: Negative.   Psychiatric/Behavioral: Positive for substance abuse.    Blood pressure 177/109, pulse 77, temperature 97.8 F (36.6 C), temperature source Oral, resp. rate 20, height 5\' 9"  (1.753 m), weight 85.276 kg (188 lb), SpO2 100 %.Body mass index is 27.75 kg/(m^2).  General Appearance: Fairly Groomed  Patent attorneyye Contact::  Fair  Speech:  Clear and Coherent409  Volume:  Normal  Mood:  Euthymic  Affect:  Restricted  Thought Process:  Coherent and Goal Directed  Orientation:  Full (Time, Place, and Person)  Thought Content:  plans as he moves on, relapse prevention plan  Suicidal Thoughts:  No  Homicidal Thoughts:  No  Memory:  Immediate;   Fair Recent;   Fair Remote;   Fair  Judgement:  Fair  Insight:  Present and Shallow  Psychomotor Activity:  Normal  Concentration:  Fair  Recall:  F[employer]Fair  Fund of Knowledge:Fair  Language: Fair  Akathisia:  No  Handed:  Right  AIMS (if indicated):     Assets:  Desire for Improvement  Sleep:  Number of Hours: 6.75  Cognition: WNL  ADL's:  Intact      Has this patient used any form of tobacco in the last 30 days? (Cigarettes, Smokeless Tobacco, Cigars, and/or Pipes) Yes, A prescription for an FDA-approved tobacco cessation medication was offered at discharge and the patient refused  Mental Status Per Nursing Assessment::   On Admission:     Current Mental Status by Physician: IN full  contact with reality. There are no active S/S of withdrawal. There are no active SI plans or intent. [facility] term residential treatment   Loss Factors: NA  Historical Factors: NA  Risk Reduction Factors:   Living with another person, especially a relative and Positive social support  Continued Clinical Symptoms:  Alcohol/Substance Abuse/Dependencies  Cognitive Features That Contribute To Risk:  Closed-mindedness, Polarized thinking and Thought constriction (tunnel vision)    Suicide Risk:  Minimal: No identifiable suicidal ideation.  Patients presenting with no risk factors but with morbid ruminations; may be classified as minimal risk based on the severity of the depressive symptoms  Principal Problem: Substance or medication-induced anxiety disorder [facility](HCC) Discharge Diagnoses:  Patient Active Problem List   Diagnosis Date Noted  . Cocaine use disorder, severe, dependence (HCC) [F14.20] 10/06/2015  . 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 Substance or medication-induced anxiety disorder (HCC) [F19.980] 10/06/2015  . Hypokalemia [E87.6] 10/06/2015  . Suicidal ideation [R45.851]   . Unintended weight loss [R63.4] 04/09/2015  . HTN (hypertension) [I10] 11/21/2014  . History of stroke [Z86.73] 11/21/2014  . History of thyroid disease [Z86.39] 11/21/2014    Follow-up Information    Follow up with [facility].   Why:  Walk in between 8am-9am Monday through Friday for hospital follow-up/medication management/assessment for counseling services.    Contact information:   [address] [place], [place] [address] Phone: [phone] Fax:  [phone]      Plan Of Care/Follow-up recommendations:  Activity:  as tolerated Diet:  regular Follow up [facility], [facility] Is patient on multiple antipsychotic therapies at discharge:  No   Has Patient had three or more  failed trials of antipsychotic monotherapy by history:  No  Recommended Plan for Multiple Antipsychotic Therapies: NA    Medina Degraffenreid A 10/09/2015, 10:29 AM

## 2015-10-09 NOTE — Progress Notes (Signed)
D: Patient observed in milieu watching television. Patient seems a little brighter today. Patient states his goal is "one day at a time." Patient denies SI and HI and A/V hallucinations and contracts for safety. Patient states he didn't sleep well last night.  A: Support and encouragement offered. Patient administered trazodone 150 mg for sleep as ordered per patient request. Will monitor for results. Q 15 minute checks in progress and maintained for safety.  R: Patient receptive to care and treatment. Patient remains safe on unit. Monitoring continues.

## 2015-11-06 ENCOUNTER — Telehealth: Payer: Self-pay

## 2015-11-06 ENCOUNTER — Telehealth: Payer: Self-pay | Admitting: Internal Medicine

## 2015-11-06 MED FILL — LOSARTAN POTASSIUM 50 MG TA: 50 | 30 days supply | Qty: 30 | Fill #4

## 2015-11-06 MED FILL — AMLODIPINE BESYLATE 10 MG T: 10 | 30 days supply | Qty: 30 | Fill #4

## 2015-11-06 NOTE — Telephone Encounter (Signed)
Returned call to patient  Patient not available Left message with person at [facility] house To have him return our call

## 2015-11-06 NOTE — Telephone Encounter (Signed)
Patient is coming in on 1/30 for his blood pressure...he is out of his medications and would like a medication refill......Marland Kitchenplease follow up with patient

## 2015-11-11 ENCOUNTER — Encounter: Payer: Self-pay | Admitting: Internal Medicine

## 2015-11-11 ENCOUNTER — Ambulatory Visit: Payer: Self-pay | Attending: Internal Medicine | Admitting: Internal Medicine

## 2015-11-11 VITALS — BP 152/92 | HR 53 | Temp 98.0°F | Resp 16 | Wt 198.0 lb

## 2015-11-11 DIAGNOSIS — Z79899 Other long term (current) drug therapy: Secondary | ICD-10-CM | POA: Insufficient documentation

## 2015-11-11 DIAGNOSIS — R51 Headache: Secondary | ICD-10-CM | POA: Insufficient documentation

## 2015-11-11 DIAGNOSIS — Z7982 Long term (current) use of aspirin: Secondary | ICD-10-CM | POA: Insufficient documentation

## 2015-11-11 DIAGNOSIS — E039 Hypothyroidism, unspecified: Secondary | ICD-10-CM

## 2015-11-11 DIAGNOSIS — I10 Essential (primary) hypertension: Secondary | ICD-10-CM

## 2015-11-11 LAB — BASIC METABOLIC PANEL
BUN: 14 mg/dL (ref 7–25)
CO2: 30 mmol/L (ref 20–31)
Calcium: 9.1 mg/dL (ref 8.6–10.3)
Chloride: 104 mmol/L (ref 98–110)
Creat: 0.84 mg/dL (ref 0.70–1.33)
Glucose, Bld: 78 mg/dL (ref 65–99)
Potassium: 4.4 mmol/L (ref 3.5–5.3)
Sodium: 139 mmol/L (ref 135–146)

## 2015-11-11 MED ORDER — LISINOPRIL 5 MG PO TABS: 5.0000 mg | ORAL_TABLET | Freq: Every day | ORAL | Status: DC

## 2015-11-11 MED ORDER — AMLODIPINE BESYLATE 10 MG PO TABS: 10.0000 mg | ORAL_TABLET | Freq: Every day | ORAL | Status: DC

## 2015-11-11 MED ORDER — LEVOTHYROXINE SODIUM 50 MCG PO TABS: 50.0000 ug | ORAL_TABLET | Freq: Every day | ORAL | Status: DC

## 2015-11-11 MED FILL — ?LEVOTHYROXINE 50 MCG TABLE: 50 | 30 days supply | Qty: 30 | Fill #0

## 2015-11-11 MED FILL — LISINOPRIL 5 MG TABLET: 5 | 30 days supply | Qty: 30 | Fill #0

## 2015-11-11 NOTE — Progress Notes (Signed)
Patient ID: Gene Garcia, male   DOB: 11/23/1960, 55 y.o.   MRN: [id number] Subjective:  Gene Garcia is a 55 y.o. male with hypertension and hypothyroidism. Patient reports that he continues to live in a shelter. He takes his medication daily without complications. He admits to taking his thyroid medication after he has breakfast. He complains of headaches, fatigue, and weight loss. He denies chest pain, palpitations, SOB, edema. He also admits to stopping all of his psych medications because he felt it made him urinate too much and feel tired. He states that he does not want to get hooked on psych medications.  Current Outpatient Prescriptions  Medication Sig Dispense Refill  . amLODipine (NORVASC) 10 MG tablet Take 1 tablet (10 mg total) by mouth daily. For high blood pressure 30 tablet 0  . aspirin EC 81 MG tablet Take 1 tablet (81 mg total) by mouth daily. For heart health 60 tablet 5  . hydrOXYzine (ATARAX/VISTARIL) 25 MG tablet Take 1 tablet (25 mg total) by mouth every 6 (six) hours as needed for anxiety. 45 tablet 0  . levothyroxine (SYNTHROID, LEVOTHROID) 25 MCG tablet Take 1 tablet (25 mcg total) by mouth daily before breakfast. For low functioning thyroid    . sertraline (ZOLOFT) 50 MG tablet Take 1 tablet (50 mg total) by mouth daily. For depression 30 tablet 0  . traZODone (DESYREL) 150 MG tablet Take 1 tablet (150 mg total) by mouth at bedtime as needed for sleep. 30 tablet 0   No current facility-administered medications for this visit.    Hypertension ROS: taking medications as instructed, no medication side effects noted, no TIA's, no chest pain on exertion, no dyspnea on exertion, no swelling of ankles and no palpitations.    Objective:  BP 173/99 mmHg  Pulse 53  Temp(Src) 98 F (36.7 C)  Resp 16  Wt 198 lb (89.812 kg)  SpO2 100%  Appearance alert, well appearing, and in no distress, oriented to person, place, and time and normal appearing weight. General exam BP noted to  be well controlled today in office, S1, S2 normal, no gallop, no murmur, chest clear, no JVD, no HSM, no edema.  Lab review: orders written for new lab studies as appropriate; see orders.   Assessment:   Gene Garcia was seen today for follow-up.  Diagnoses and all orders for this visit:  Essential hypertension -     amLODipine (NORVASC) 10 MG tablet; Take 1 tablet (10 mg total) by mouth daily. For high blood pressure -     Basic Metabolic Panel -     lisinopril (PRINIVIL,ZESTRIL) 5 MG tablet; Take 1 tablet (5 mg total) by mouth daily. For Blood pressure BP is uncontrolled today. I have added low dose lisinopril. DASH diet advised.   Hypothyroidism, unspecified hypothyroidism type -     levothyroxine (SYNTHROID, LEVOTHROID) 50 MCG tablet; Take 1 tablet (50 mcg total) by mouth daily before breakfast. For Thyroid Based off EMR review patient had a TSH level that was elevated 3 weeks ago. I will increase his synthroid today and have him to come back for a recheck in 2 weeks.   Plan:  Reviewed diet, exercise and weight control. Recommended sodium restriction. Copy of written low fat low cholesterol diet provided and reviewed. Cardiovascular risk and specific lipid/LDL goals reviewed..   Return in about 3 months (around 02/09/2016) for Hypertension and 2 week RN visit-BP check.   Ambrose Finland, NP 11/11/2015 11:01 AM

## 2015-11-11 NOTE — Patient Instructions (Addendum)
Take your Levothyroxine/Synthroid at least 30 minutes before you eat breakfast. I have increased your dose, you will need to return back in 2 months for a blood retest.   Make sure you call back in 3 days for lab results.   2 weeks I need you to come back for a quick BP recheck

## 2015-11-11 NOTE — Progress Notes (Signed)
Patient here for follow up on his HTN and thyroid disease Patient presents in office with elevated blood pressure and stated He did take his medication this am

## 2015-11-12 ENCOUNTER — Telehealth: Payer: Self-pay

## 2015-11-12 NOTE — Telephone Encounter (Signed)
-----   Message from Ambrose Finland, NP sent at 11/12/2015  7:21 AM EST ----- Labs are within normal limits

## 2015-11-12 NOTE — Telephone Encounter (Signed)
Tried to call patient  Patient not available Left message at front desk for patient to return our call

## 2015-11-13 ENCOUNTER — Telehealth: Payer: Self-pay

## 2015-11-13 NOTE — Telephone Encounter (Signed)
Returned patient phone call Patient stays at the [facility] house Currently after hours Message left in general mailbox for him to return our call

## 2015-11-13 NOTE — Telephone Encounter (Signed)
Patient called returning nurse's call to review results  °

## 2015-12-03 MED FILL — AMLODIPINE BESYLATE 10 MG T: 10 | 30 days supply | Qty: 30 | Fill #0

## 2015-12-03 MED FILL — LISINOPRIL 5 MG TABLET: 5 | 30 days supply | Qty: 30 | Fill #1

## 2015-12-03 MED FILL — ?LEVOTHYROXINE 50 MCG TABLE: 50 | 30 days supply | Qty: 30 | Fill #1

## 2015-12-06 ENCOUNTER — Encounter: Payer: Self-pay | Admitting: Clinical

## 2015-12-06 NOTE — Progress Notes (Signed)
Depression screen [facility] 2/9 11/11/2015 11/21/2014  Decreased Interest 3 0  Down, Depressed, Hopeless 3 1  PHQ - 2 Score 6 1  Altered sleeping 3 -  Tired, decreased energy 3 -  Change in appetite 3 -  Trouble concentrating 3 -  Moving slowly or fidgety/restless 3 -  Suicidal thoughts 3 -  PHQ-9 Score 24 -   *No plans to end life in last 2 weeks  GAD 7 : Generalized Anxiety Score 11/11/2015  Nervous, Anxious, on Edge 3  Control/stop worrying 3  Worry too much - different things 3  Trouble relaxing 3  Restless 3  Easily annoyed or irritable 3  Afraid - awful might happen 3  Total GAD 7 Score 21

## 2015-12-11 ENCOUNTER — Emergency Department (HOSPITAL_COMMUNITY): Payer: Self-pay

## 2015-12-11 ENCOUNTER — Emergency Department (HOSPITAL_COMMUNITY)
Admission: EM | Admit: 2015-12-11 | Discharge: 2015-12-11 | Disposition: A | Discharge status: Home / Self Care | Payer: Self-pay | Attending: Emergency Medicine | Admitting: Emergency Medicine

## 2015-12-11 ENCOUNTER — Encounter (HOSPITAL_COMMUNITY): Payer: Self-pay | Admitting: *Deleted

## 2015-12-11 DIAGNOSIS — E079 Disorder of thyroid, unspecified: Secondary | ICD-10-CM | POA: Insufficient documentation

## 2015-12-11 DIAGNOSIS — J45909 Unspecified asthma, uncomplicated: Secondary | ICD-10-CM | POA: Insufficient documentation

## 2015-12-11 DIAGNOSIS — Z79899 Other long term (current) drug therapy: Secondary | ICD-10-CM | POA: Insufficient documentation

## 2015-12-11 DIAGNOSIS — I10 Essential (primary) hypertension: Secondary | ICD-10-CM | POA: Insufficient documentation

## 2015-12-11 DIAGNOSIS — R0789 Other chest pain: Secondary | ICD-10-CM | POA: Insufficient documentation

## 2015-12-11 DIAGNOSIS — Z7982 Long term (current) use of aspirin: Secondary | ICD-10-CM | POA: Insufficient documentation

## 2015-12-11 LAB — BASIC METABOLIC PANEL
Anion gap: 9 (ref 5–15)
BUN: 11 mg/dL (ref 6–20)
CO2: 26 mmol/L (ref 22–32)
CREATININE: 0.95 mg/dL (ref 0.61–1.24)
Calcium: 8.7 mg/dL — ABNORMAL LOW (ref 8.9–10.3)
Chloride: 107 mmol/L (ref 101–111)
GFR calc Af Amer: >60 mL/min (ref >60)
GLUCOSE: 87 mg/dL (ref 65–99)
POTASSIUM: 3.7 mmol/L (ref 3.5–5.1)
SODIUM: 142 mmol/L (ref 135–145)

## 2015-12-11 LAB — I-STAT TROPONIN, ED: Troponin i, poc: 0 ng/mL (ref 0.00–0.08)

## 2015-12-11 LAB — CBC WITH DIFFERENTIAL/PLATELET
Basophils Absolute: 0 K/uL (ref 0.0–0.1)
Basophils Relative: 0 %
Eosinophils Absolute: 0.1 K/uL (ref 0.0–0.7)
Eosinophils Relative: 2 %
HCT: 40.4 % (ref 39.0–52.0)
Hemoglobin: 12.9 g/dL — ABNORMAL LOW (ref 13.0–17.0)
Lymphocytes Relative: 29 %
Lymphs Abs: 1.3 K/uL (ref 0.7–4.0)
MCH: 26.9 pg (ref 26.0–34.0)
MCHC: 31.9 g/dL (ref 30.0–36.0)
MCV: 84.2 fL (ref 78.0–100.0)
Monocytes Absolute: 0.2 K/uL (ref 0.1–1.0)
Monocytes Relative: 5 %
Neutro Abs: 2.9 K/uL (ref 1.7–7.7)
Neutrophils Relative %: 64 %
Platelets: 121 K/uL — ABNORMAL LOW (ref 150–400)
RBC: 4.8 MIL/uL (ref 4.22–5.81)
RDW: 14.2 % (ref 11.5–15.5)
WBC: 4.6 K/uL (ref 4.0–10.5)

## 2015-12-11 NOTE — ED Notes (Signed)
Brought patient back to room, patient undressed, in gown, on monitor, continuous pulse oximetry and blood pressure cuff 

## 2015-12-11 NOTE — ED Provider Notes (Signed)
CSN: [id number]     Arrival date & time 12/11/15  0754 History   First MD Initiated Contact with Patient 12/11/15 [phone]     Chief Complaint  Patient presents with  . Chest Pain     HPI   Gene Garcia is an 55 y.o. male with history of HTN, depression, and asthma who presents to the ED for evaluation of chest pain. He reports a burning substernal chest pain that started yesterday morning around 7 or 8 AM while he was at work. He works at the [employer]. He states the pain comes and goes and feels like a burning dull pain. He states when it comes on it is a 7/10. He states the episodes last a few seconds and resolve on their own. He denies associated diaphoresis, SOB, weakness, lightheadedness. Denies radiation of the pain. Denies N/V, abdominal pain, fever, chills. He has not tried anything to alleviate his symptoms. States he is pain free in the ED today. He states he thinks it might be stress or GERD; he does endorse a lot of work related stress and anxiety. He has a complex social situation which adds to his stress. Denies SI/HI.   Past Medical History  Diagnosis Date  . Thyroid disease   . Hypertension   . Asthma    History reviewed. No pertinent past surgical history. Family History  Problem Relation Age of Onset  . Diabetes Maternal Aunt    Social History  Substance Use Topics  . Smoking status: Never Smoker   . Smokeless tobacco: None  . Alcohol Use: No    Review of Systems  All other systems reviewed and are negative.     Allergies  Review of patient's allergies indicates no known allergies.  Home Medications   Prior to Admission medications   Medication Sig Start Date End Date Taking? Authorizing Provider  amLODipine (NORVASC) 10 MG tablet Take 1 tablet (10 mg total) by mouth daily. For high blood pressure 11/11/15   Ambrose Finland, NP  aspirin EC 81 MG tablet Take 1 tablet (81 mg total) by mouth daily. For heart health 10/09/15   Sanjuana Kava, NP  hydrOXYzine  (ATARAX/VISTARIL) 25 MG tablet Take 1 tablet (25 mg total) by mouth every 6 (six) hours as needed for anxiety. 10/09/15   Sanjuana Kava, NP  levothyroxine (SYNTHROID, LEVOTHROID) 50 MCG tablet Take 1 tablet (50 mcg total) by mouth daily before breakfast. For Thyroid 11/11/15   Ambrose Finland, NP  lisinopril (PRINIVIL,ZESTRIL) 5 MG tablet Take 1 tablet (5 mg total) by mouth daily. For Blood pressure 11/11/15   Ambrose Finland, NP  sertraline (ZOLOFT) 50 MG tablet Take 1 tablet (50 mg total) by mouth daily. For depression 10/09/15   Sanjuana Kava, NP  traZODone (DESYREL) 150 MG tablet Take 1 tablet (150 mg total) by mouth at bedtime as needed for sleep. 10/09/15   Sanjuana Kava, NP   BP 135/93 mmHg  Pulse 54  Temp(Src) 97.8 F (36.6 C) (Oral)  Resp 18  SpO2 100% Physical Exam  Constitutional: He is oriented to person, place, and time.  HENT:  Right Ear: External ear normal.  Left Ear: External ear normal.  Nose: Nose normal.  Mouth/Throat: Oropharynx is clear and moist. No oropharyngeal exudate.  Eyes: Conjunctivae and EOM are normal. Pupils are equal, round, and reactive to light.  Neck: Normal range of motion. Neck supple.  Cardiovascular: Normal rate, regular rhythm, normal heart sounds and intact distal  pulses.   Pulmonary/Chest: Effort normal and breath sounds normal. No respiratory distress. He has no wheezes.    Chest pain is reproducible with palpation  Abdominal: Soft. Bowel sounds are normal. He exhibits no distension. There is no tenderness. There is no rebound and no guarding.  Musculoskeletal: He exhibits no edema.  No LE edema  Neurological: He is alert and oriented to person, place, and time. No cranial nerve deficit.  Skin: Skin is warm and dry.  Psychiatric:  Depressed or subdued affect  Nursing note and vitals reviewed.  Filed Vitals:   12/11/15 0806 12/11/15 0815 12/11/15 0821 12/11/15 0830  BP: 135/93 134/93  146/88  Pulse: 54 50  47  Temp: 97.8 F (36.6 C)       TempSrc: Oral     Resp: Height:    (1.753 m)   Weight:   90.719 kg   SpO2: 100% 100%  97%     ED Course  Procedures (including critical care time) Labs Review Labs Reviewed  BASIC METABOLIC PANEL - Abnormal; Notable for the following:    Calcium 8.7 (*)    All other components within normal limits  CBC WITH DIFFERENTIAL/PLATELET - Abnormal; Notable for the following:    Hemoglobin 12.9 (*)    Platelets 121 (*)    All other components within normal limits  I-STAT TROPOININ, ED    Imaging Review Dg Chest 2 View  12/11/2015  CLINICAL DATA:  Midline chest pain yesterday, hypertension, asthma, thyroid disease EXAM: CHEST  2 VIEW COMPARISON:  10/16/2014 FINDINGS: Normal heart size, mediastinal contours, and pulmonary vascularity. Lungs clear. No pleural effusion or pneumothorax. Dextro convex upper thoracic scoliosis. Bones demineralized. IMPRESSION: No acute abnormalities. Electronically Signed   By: Ulyses Southward M.D.   On: 12/11/2015 08:47   I have personally reviewed and evaluated these images and lab results as part of my medical decision-making.   EKG Interpretation   Date/Time:  Wednesday December 11 2015 08:02:53 EST Ventricular Rate:  51 PR Interval:  160 QRS Duration: 92 QT Interval:  422 QTC Calculation: 389 R Axis:   34 Text Interpretation:  Sinus rhythm Normal ECG No significant change since  last tracing Confirmed by Anitra Lauth  MD, Alphonzo Lemmings ([id number]) on 12/11/2015  8:13:29 AM      MDM   Final diagnoses:  Atypical chest pain    Gene Garcia is an 55 y.o. male with h/o HTN, depression, asthma who presents with substernal chest pain that started yesterday morning at work. EKG in NSR and unremarkable. Troponin negative. CBC BMP at baseline. CXR negative. He is pain free at rest in the ED with some reproducible pain on palpation of his chest. HEART score 2. I have low suspicion for ACS. Discussion with pt reveals he continues to have issues with stress,  anxiety, and depression due to work and social situation. I suspect there is a large component of stress/anxiety related to his symptoms. Ddx also includes msk pain as pt has a physically strenuous job, or GERD. No indication for further emergent evaluation or treatment at this time. Instructed to [facility] within one [facility]. ER return precautions given. Pt verbalized agreement and understanding of plan.     Carlene Coria, PA-C 12/11/15 6045  Gwyneth Sprout, MD 12/12/15 [phone]

## 2015-12-11 NOTE — Discharge Instructions (Signed)
You were seen in the ER today for evaluation of chest pain. All of your workup was normal. As we discussed, I suspect some of your pain is due to stress/anxiety. Please follow up with your primary care provider within one week. Return to the ER for new or worsening symptoms.

## 2015-12-11 NOTE — ED Notes (Signed)
Pt off unit with xray 

## 2015-12-11 NOTE — ED Notes (Signed)
Pt states chest pain since yesterday with central pressure, light headed, SOB.  C/O pain 7/10 central chest. Pt doesn't appear in acute distress at this time.

## 2016-01-07 MED FILL — LISINOPRIL 5 MG TABLET: 5 | 30 days supply | Qty: 30 | Fill #2

## 2016-01-07 MED FILL — AMLODIPINE BESYLATE 10 MG T: 10 | 30 days supply | Qty: 30 | Fill #1

## 2016-01-07 MED FILL — LEVOTHYROXINE 50 MCG TABLET: 50 | 30 days supply | Qty: 30 | Fill #2

## 2016-02-11 MED FILL — AMLODIPINE BESYLATE 10 MG T: 10 | 30 days supply | Qty: 30 | Fill #2

## 2016-02-11 MED FILL — LISINOPRIL 5 MG TABLET: 5 | 30 days supply | Qty: 30 | Fill #3

## 2016-02-11 MED FILL — LEVOTHYROXINE 50 MCG TABLET: 50 | 30 days supply | Qty: 30 | Fill #3

## 2016-03-16 MED FILL — ?AMLODIPINE BESYLATE 10 MG: 10 | 30 days supply | Qty: 30 | Fill #3

## 2016-03-16 MED FILL — ?LEVOTHYROXINE 50 MCG TABLE: 50 | 30 days supply | Qty: 30 | Fill #4

## 2016-03-16 MED FILL — LISINOPRIL 5 MG TABLET: 5 | 30 days supply | Qty: 30 | Fill #4

## 2016-05-26 ENCOUNTER — Emergency Department (HOSPITAL_COMMUNITY): Payer: Self-pay

## 2016-05-26 ENCOUNTER — Encounter (HOSPITAL_COMMUNITY): Payer: Self-pay

## 2016-05-26 ENCOUNTER — Observation Stay (HOSPITAL_COMMUNITY)
Admission: EM | Admit: 2016-05-26 | Discharge: 2016-06-01 | Disposition: A | Discharge status: Home / Self Care | Payer: Self-pay | Attending: Internal Medicine | Admitting: Internal Medicine

## 2016-05-26 DIAGNOSIS — Z7982 Long term (current) use of aspirin: Secondary | ICD-10-CM | POA: Insufficient documentation

## 2016-05-26 DIAGNOSIS — F14229 Cocaine dependence with intoxication, unspecified: Secondary | ICD-10-CM | POA: Insufficient documentation

## 2016-05-26 DIAGNOSIS — F1410 Cocaine abuse, uncomplicated: Secondary | ICD-10-CM

## 2016-05-26 DIAGNOSIS — E876 Hypokalemia: Secondary | ICD-10-CM | POA: Insufficient documentation

## 2016-05-26 DIAGNOSIS — R079 Chest pain, unspecified: Principal | ICD-10-CM | POA: Insufficient documentation

## 2016-05-26 DIAGNOSIS — R45851 Suicidal ideations: Secondary | ICD-10-CM | POA: Insufficient documentation

## 2016-05-26 DIAGNOSIS — E039 Hypothyroidism, unspecified: Secondary | ICD-10-CM | POA: Insufficient documentation

## 2016-05-26 DIAGNOSIS — F1420 Cocaine dependence, uncomplicated: Secondary | ICD-10-CM | POA: Diagnosis present

## 2016-05-26 DIAGNOSIS — Z79899 Other long term (current) drug therapy: Secondary | ICD-10-CM | POA: Insufficient documentation

## 2016-05-26 DIAGNOSIS — F329 Major depressive disorder, single episode, unspecified: Secondary | ICD-10-CM | POA: Insufficient documentation

## 2016-05-26 DIAGNOSIS — D696 Thrombocytopenia, unspecified: Secondary | ICD-10-CM | POA: Insufficient documentation

## 2016-05-26 DIAGNOSIS — Z8673 Personal history of transient ischemic attack (TIA), and cerebral infarction without residual deficits: Secondary | ICD-10-CM | POA: Insufficient documentation

## 2016-05-26 DIAGNOSIS — I10 Essential (primary) hypertension: Secondary | ICD-10-CM | POA: Insufficient documentation

## 2016-05-26 HISTORY — DX: Hypothyroidism, unspecified: E03.9

## 2016-05-26 LAB — ACETAMINOPHEN LEVEL: Acetaminophen (Tylenol), Serum: <10 ug/mL — ABNORMAL LOW (ref 10–30)

## 2016-05-26 LAB — ETHANOL

## 2016-05-26 LAB — COMPREHENSIVE METABOLIC PANEL
ALBUMIN: 4.3 g/dL (ref 3.5–5.0)
ALT: 22 U/L (ref 17–63)
AST: 27 U/L (ref 15–41)
Alkaline Phosphatase: 74 U/L (ref 38–126)
Anion gap: 7 (ref 5–15)
BUN: 16 mg/dL (ref 6–20)
CHLORIDE: 105 mmol/L (ref 101–111)
CO2: 28 mmol/L (ref 22–32)
CREATININE: 1.09 mg/dL (ref 0.61–1.24)
Calcium: 9 mg/dL (ref 8.9–10.3)
GFR calc Af Amer: >60 mL/min (ref >60)
Glucose, Bld: 80 mg/dL (ref 65–99)
Potassium: 3.4 mmol/L — ABNORMAL LOW (ref 3.5–5.1)
SODIUM: 140 mmol/L (ref 135–145)
Total Bilirubin: 1.3 mg/dL — ABNORMAL HIGH (ref 0.3–1.2)
Total Protein: 7.6 g/dL (ref 6.5–8.1)

## 2016-05-26 LAB — CBC
HCT: 44.3 % (ref 39.0–52.0)
HEMOGLOBIN: 14.5 g/dL (ref 13.0–17.0)
MCH: 27.8 pg (ref 26.0–34.0)
MCHC: 32.7 g/dL (ref 30.0–36.0)
MCV: 84.9 fL (ref 78.0–100.0)
PLATELETS: 130 10*3/uL — AB (ref 150–400)
RBC: 5.22 MIL/uL (ref 4.22–5.81)
RDW: 14.3 % (ref 11.5–15.5)
WBC: 6.6 10*3/uL (ref 4.0–10.5)

## 2016-05-26 LAB — SALICYLATE LEVEL: Salicylate Lvl: <4 mg/dL (ref 2.8–30.0)

## 2016-05-26 LAB — TROPONIN I: Troponin I: 0.03 ng/mL (ref <0.03)

## 2016-05-26 MED ORDER — TRAZODONE HCL 50 MG PO TABS
150.0000 mg | ORAL_TABLET | Freq: Every evening | ORAL | Status: DC | PRN
  Filled 2016-05-26: qty 3

## 2016-05-26 MED ORDER — AMLODIPINE BESYLATE 10 MG PO TABS
10.0000 mg | ORAL_TABLET | Freq: Every day | ORAL | Status: DC
  Administered 2016-05-27 – 2016-06-01 (×6): 10 mg via ORAL
  Filled 2016-05-26 (×6): qty 1

## 2016-05-26 MED ORDER — HYDROXYZINE HCL 25 MG PO TABS: 25.0000 mg | ORAL_TABLET | Freq: Four times a day (QID) | ORAL | Status: DC | PRN

## 2016-05-26 MED ORDER — LISINOPRIL 10 MG PO TABS
5.0000 mg | ORAL_TABLET | Freq: Every day | ORAL | Status: DC
  Administered 2016-05-27 – 2016-05-29 (×3): 5 mg via ORAL
  Filled 2016-05-26 (×3): qty 1

## 2016-05-26 MED ORDER — ALPRAZOLAM 0.25 MG PO TABS: 0.2500 mg | ORAL_TABLET | Freq: Two times a day (BID) | ORAL | Status: DC | PRN

## 2016-05-26 MED ORDER — ACETAMINOPHEN 325 MG PO TABS: 650.0000 mg | ORAL_TABLET | ORAL | Status: DC | PRN

## 2016-05-26 MED ORDER — ASPIRIN EC 325 MG PO TBEC
325.0000 mg | DELAYED_RELEASE_TABLET | Freq: Every day | ORAL | Status: DC
  Administered 2016-05-26 – 2016-06-01 (×7): 325 mg via ORAL
  Filled 2016-05-26 (×7): qty 1

## 2016-05-26 MED ORDER — ZOLPIDEM TARTRATE 5 MG PO TABS: 5.0000 mg | ORAL_TABLET | Freq: Every evening | ORAL | Status: DC | PRN

## 2016-05-26 MED ORDER — SERTRALINE HCL 50 MG PO TABS
50.0000 mg | ORAL_TABLET | Freq: Every day | ORAL | Status: DC
  Administered 2016-05-26 – 2016-06-01 (×7): 50 mg via ORAL
  Filled 2016-05-26 (×7): qty 1

## 2016-05-26 MED ORDER — MORPHINE SULFATE (PF) 2 MG/ML IV SOLN: 2.0000 mg | INTRAVENOUS | Status: DC | PRN

## 2016-05-26 MED ORDER — ENOXAPARIN SODIUM 40 MG/0.4ML ~~LOC~~ SOLN
40.0000 mg | SUBCUTANEOUS | Status: DC
  Administered 2016-05-26 – 2016-06-01 (×6): 40 mg via SUBCUTANEOUS
  Filled 2016-05-26 (×6): qty 0.4

## 2016-05-26 MED ORDER — SODIUM CHLORIDE 0.9 % IV BOLUS (SEPSIS)
1000.0000 mL | Freq: Once | INTRAVENOUS | Status: AC
  Administered 2016-05-26: 1000 mL via INTRAVENOUS

## 2016-05-26 MED ORDER — LEVOTHYROXINE SODIUM 50 MCG PO TABS
50.0000 ug | ORAL_TABLET | Freq: Every day | ORAL | Status: DC
  Administered 2016-05-27 – 2016-06-01 (×6): 50 ug via ORAL
  Filled 2016-05-26 (×6): qty 1

## 2016-05-26 MED ORDER — ASPIRIN 325 MG PO TABS: 325.0000 mg | ORAL_TABLET | Freq: Every day | ORAL | Status: DC

## 2016-05-26 MED ORDER — GI COCKTAIL ~~LOC~~: 30.0000 mL | Freq: Four times a day (QID) | ORAL | Status: DC | PRN

## 2016-05-26 MED ORDER — ONDANSETRON HCL 4 MG/2ML IJ SOLN: 4.0000 mg | Freq: Four times a day (QID) | INTRAMUSCULAR | Status: DC | PRN

## 2016-05-26 NOTE — ED Notes (Signed)
Hospitalist at bedside 

## 2016-05-26 NOTE — ED Notes (Signed)
PT used restroom while in triage did not get urine sample

## 2016-05-26 NOTE — ED Provider Notes (Signed)
[facility]-EMERGENCY DEPT Provider Note   CSN: [id number] Arrival date & time: 05/26/16  1558     History   Chief Complaint Chief Complaint  Patient presents with  . Chest Pain  . Suicidal    HPI Gene Garcia is a 55 y.o. male.  The history is provided by the patient.  Chest Pain   Associated symptoms include cough. Pertinent negatives include no abdominal pain and no weakness.  Patient presents with chest pain and suicidal thoughts/depression. States for the last few days has been having chest pain. States his been constant since yesterday. As dull in his midchest. States it is worse when he is outside in the heat. States it is feeling better now. He is also been smoking cocaine. No coronary artery disease history. Does have history of cocaine use. Did have a stroke years ago. Occasional cough. No sputum production. Has been somewhat worse with exertion and heat. Also depressed. Has had suicidal thoughts. States he does not have a specific plan at this time but thoughts of been getting worse. He states he has not eaten in 5 days.  Past Medical History:  Diagnosis Date  . Asthma   . Hypertension   . Thyroid disease     Patient Active Problem List   Diagnosis Date Noted  . Chest pain 05/26/2016  . Cocaine use disorder, severe, dependence (HCC) 10/06/2015  . 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 Substance or medication-induced anxiety disorder (HCC) 10/06/2015  . Hypokalemia 10/06/2015  . Suicidal ideation   . Unintended weight loss 04/09/2015  . HTN (hypertension) 11/21/2014  . History of stroke 11/21/2014  . History of thyroid disease 11/21/2014    History reviewed. No pertinent surgical history.     Home Medications    Prior to Admission medications   Medication Sig Start Date End Date Taking? Authorizing Provider  amLODipine (NORVASC) 10 MG tablet Take 1 tablet (10 mg total) by mouth daily. For high  blood pressure 11/11/15  Yes Ambrose Finland, NP  levothyroxine (SYNTHROID, LEVOTHROID) 50 MCG tablet Take 1 tablet (50 mcg total) by mouth daily before breakfast. For Thyroid 11/11/15  Yes Ambrose Finland, NP  lisinopril (PRINIVIL,ZESTRIL) 5 MG tablet Take 1 tablet (5 mg total) by mouth daily. For Blood pressure 11/11/15  Yes Ambrose Finland, NP  aspirin EC 81 MG tablet Take 1 tablet (81 mg total) by mouth daily. For heart health Patient not taking: Reported on 05/26/2016 10/09/15   Sanjuana Kava, NP  hydrOXYzine (ATARAX/VISTARIL) 25 MG tablet Take 1 tablet (25 mg total) by mouth every 6 (six) hours as needed for anxiety. 10/09/15   Sanjuana Kava, NP  sertraline (ZOLOFT) 50 MG tablet Take 1 tablet (50 mg total) by mouth daily. For depression Patient not taking: Reported on 05/26/2016 10/09/15   Sanjuana Kava, NP  traZODone (DESYREL) 150 MG tablet Take 1 tablet (150 mg total) by mouth at bedtime as needed for sleep. Patient not taking: Reported on 05/26/2016 10/09/15   Sanjuana Kava, NP    Family History Family History  Problem Relation Age of Onset  . Diabetes Maternal Aunt     Social History Social History  Substance Use Topics  . Smoking status: Never Smoker  . Smokeless tobacco: Never Used  . Alcohol use No     Allergies   Review of patient's allergies indicates no known allergies.   Review of Systems Review of Systems  Constitutional: Positive for  appetite change.  HENT: Negative for trouble swallowing.   Respiratory: Positive for cough. Negative for chest tightness.   Cardiovascular: Positive for chest pain.  Gastrointestinal: Negative for abdominal pain.  Genitourinary: Negative for flank pain.  Musculoskeletal: Negative for neck stiffness.  Skin: Negative for wound.  Neurological: Negative for weakness and light-headedness.  Psychiatric/Behavioral: Positive for dysphoric mood and suicidal ideas. Negative for hallucinations.     Physical Exam Updated Vital Signs BP  141/87   Pulse (!) 54   Temp 98.1 F (36.7 C) (Oral)   Resp 20   Ht 5\' 9"  (1.753 m)   Wt 170 lb (77.1 kg)   SpO2 100%   BMI 25.10 kg/m   Physical Exam  Constitutional: He appears well-developed.  HENT:  Head: Atraumatic.  Eyes: EOM are normal.  Neck: Neck supple.  Cardiovascular: Normal rate.   Pulmonary/Chest: Effort normal.  Abdominal: Soft. There is no tenderness.  Musculoskeletal: Normal range of motion.  Neurological: He is alert.  Skin: Skin is warm. Capillary refill takes less than 2 seconds.     ED Treatments / Results  Labs (all labs ordered are listed, but only abnormal results are displayed) Labs Reviewed  COMPREHENSIVE METABOLIC PANEL - Abnormal; Notable for the following:       Result Value   Potassium 3.4 (*)    Total Bilirubin 1.3 (*)    All other components within normal limits  ACETAMINOPHEN LEVEL - Abnormal; Notable for the following:    Acetaminophen (Tylenol), Serum <10 (*)    All other components within normal limits  CBC - Abnormal; Notable for the following:    Platelets 130 (*)    All other components within normal limits  TROPONIN I - Abnormal; Notable for the following:    Troponin I 0.03 (*)    All other components within normal limits  ETHANOL  SALICYLATE LEVEL  URINE RAPID DRUG SCREEN, HOSP PERFORMED    EKG  EKG Interpretation  Date/Time:  Tuesday May 26 2016 16:08:28 EDT Ventricular Rate:  55 PR Interval:    QRS Duration: 69 QT Interval:  448 QTC Calculation: 429 R Axis:   43 Text Interpretation:  Sinus rhythm Left ventricular hypertrophy Confirmed by Rubin PayorPICKERING  MD, Precious Segall [phone]([id number]) on 05/26/2016 4:36:50 PM       EKG Interpretation  Date/Time:  Tuesday May 26 2016 17:11:27 EDT Ventricular Rate:  62 PR Interval:    QRS Duration: 71 QT Interval:  445 QTC Calculation: 452 R Axis:   58 Text Interpretation:  Sinus rhythm Consider left ventricular hypertrophy No change from earlier today Confirmed by Rubin PayorPICKERING  MD,  Genevieve Arbaugh [phone]([id number]) on 05/26/2016 5:24:40 PM        Radiology Dg Chest 2 View  Result Date: 05/26/2016 CLINICAL DATA:  Shortness of breath with central chest pain and cough. EXAM: CHEST  2 VIEW COMPARISON:  12/11/2015 FINDINGS: The heart size and mediastinal contours are within normal limits. Both lungs are clear. The visualized skeletal structures are unremarkable. IMPRESSION: No active cardiopulmonary disease. Electronically Signed   By: Kennith CenterEric  Mansell M.D.   On: 05/26/2016 17:03    Procedures Procedures (including critical care time)  Medications Ordered in ED Medications  sodium chloride 0.9 % bolus 1,000 mL (1,000 mLs Intravenous New Bag/Given 05/26/16 1818)     Initial Impression / Assessment and Plan / ED Course  I have reviewed the triage vital signs and the nursing notes.  Pertinent labs & imaging results that were available during my care of the  patient were reviewed by me and considered in my medical decision making (see chart for details).  Clinical Course  Value Comment By Time  Troponin I: (!!) 0.03 (Reviewed) Benjiman CoreNathan Jennie Hannay, MD 08/15 [id number]    Patient with cocaine abuse and chest pain. Patient states that was with exertion but only exertion when it was hot outside. EKG has nonspecific changes compared to previous but has been stable in the ER. Pain-free now. Troponin is 0.03 with normal being less than 0.03. Also suicidal. At this point will admit for chest pain rule out. Troponin is minimally elevated and will need to be followed. Admit to internal medicine.   Final Clinical Impressions(s) / ED Diagnoses   Final diagnoses:  Chest pain, unspecified chest pain type  Cocaine abuse  Suicidal ideation    New Prescriptions New Prescriptions   No medications on file     Benjiman CoreNathan Gardy Montanari, MD 05/26/16 [phone]

## 2016-05-26 NOTE — ED Notes (Signed)
Bed: WA04 Expected date:  Expected time:  Means of arrival:  Comments: Hold for triage 

## 2016-05-26 NOTE — ED Triage Notes (Addendum)
Pt presents via EMS with c/o suicidal ideation and some chest pain. Pt has a hx of depression and suicidal thoughts for years. Pt has never attempted suicide or has he been seen for the depression. Pt is also homeless. Pt reporting chest pain but pt has also been using crack cocaine for the past 5 days. Reporting the chest pain started 2 days ago. Pt given .4mg  nitro en route and 324mg  aspirin en route by EMS. When asked of a plan for SI, he said "there are so many ways to do it".

## 2016-05-26 NOTE — ED Notes (Signed)
Bed: [facility] Expected date:  Expected time:  Means of arrival:  Comments: Triaging in room  

## 2016-05-26 NOTE — H&P (Signed)
History and Physical    Gene Meringommy Viverette FAO:[id number] DOB: 01-16-1961 DOA: 05/26/2016  Referring MD/NP/PA - ED Physician PCP: Ambrose FinlandValerie A [name], NP (Pt saids he has no PCP now) Outpatient Specialists:N/A Patient coming from: Home  Chief Complaint: Chest pain  HPI: Gene Garcia is a 55 y.o. male with medical history Hypertension, previous stroke and cocaine abuse.  He presented to the ED with 2 day history mid-sternal chest pain described as Sharp aching pain, 7-8/10,  Lasting several hours, aggravated by standing outside in the heat and sometimes by exertion and sometimes not.  No known alleviating factor.  Denies shortness of breath, nausea, vomiting, diaphoresis, sputum production, No fever or chills, but admits to some intermittent dry cough.  The patient also has history of depression and PTSD and has been recently suicidal.   ED Course: At the ED patient had 12-lead EKG showed sinus bradycardia at 55 beats a minute, LVH, no acute  ST-T wave changes.  Chest x-ray was negative for any acute infiltrate.  Troponin was mildly elevated at 0.03.  His chemistries were unremarkable except for mild hypokalemia of 3.4.He had mild thrombocytopenia of 130. Drug screen for ethanol, salicylate, acetaminophen with all unremarkable.  Hospitalist service was consulted for admission  Review of Systems: As per HPI otherwise 10 point review of systems negative.    Past Medical History:  Diagnosis Date  . Asthma   . Hypertension   . Hypothyroidism   . Thyroid disease     History reviewed. No pertinent surgical history.   reports that he has never smoked. He has never used smokeless tobacco. He reports that he uses drugs, including "Crack" cocaine. He reports that he does not drink alcohol.  No Known Allergies  Family History  Problem Relation Age of Onset  . Diabetes Maternal Aunt      Prior to Admission medications   Medication Sig Start Date End Date Taking? Authorizing Provider    amLODipine (NORVASC) 10 MG tablet Take 1 tablet (10 mg total) by mouth daily. For high blood pressure 11/11/15  Yes Ambrose FinlandValerie A [name], NP  levothyroxine (SYNTHROID, LEVOTHROID) 50 MCG tablet Take 1 tablet (50 mcg total) by mouth daily before breakfast. For Thyroid 11/11/15  Yes Ambrose FinlandValerie A [name], NP  lisinopril (PRINIVIL,ZESTRIL) 5 MG tablet Take 1 tablet (5 mg total) by mouth daily. For Blood pressure 11/11/15  Yes Ambrose FinlandValerie A [name], NP  aspirin EC 81 MG tablet Take 1 tablet (81 mg total) by mouth daily. For heart health Patient not taking: Reported on 05/26/2016 10/09/15   Sanjuana KavaAgnes I Nwoko, NP  hydrOXYzine (ATARAX/VISTARIL) 25 MG tablet Take 1 tablet (25 mg total) by mouth every 6 (six) hours as needed for anxiety. 10/09/15   Sanjuana KavaAgnes I Nwoko, NP  sertraline (ZOLOFT) 50 MG tablet Take 1 tablet (50 mg total) by mouth daily. For depression Patient not taking: Reported on 05/26/2016 10/09/15   Sanjuana KavaAgnes I Nwoko, NP  traZODone (DESYREL) 150 MG tablet Take 1 tablet (150 mg total) by mouth at bedtime as needed for sleep. Patient not taking: Reported on 05/26/2016 10/09/15   Sanjuana KavaAgnes I Nwoko, NP    Physical Exam: Vitals:   05/26/16 1611 05/26/16 1809 05/26/16 1921  BP: 132/87 141/87 141/87  Pulse: (!) 53 (!) 54 62  Resp: 22 20 17   Temp: 98.1 F (36.7 C)    TempSrc: Oral    SpO2: 100% 100% 100%  Weight: 77.1 kg (170 lb)    Height: 5\' 9"  (1.753 m)  Constitutional: NAD, calm, comfortable Vitals:   05/26/16 1611 05/26/16 1809 05/26/16 1921  BP: 132/87 141/87 141/87  Pulse: (!) 53 (!) 54 62  Resp: 22 20 17   Temp: 98.1 F (36.7 C)    TempSrc: Oral    SpO2: 100% 100% 100%  Weight: 77.1 kg (170 lb)    Height: 5\' 9"  (1.753 m)     Eyes: PERRL, lids and conjunctivae normal ENMT: Mucous membranes are moist. Posterior pharynx clear of any exudate or lesions.Normal dentition.  Neck: normal, supple, no masses, no thyromegaly Respiratory: clear to auscultation bilaterally, no wheezing, no crackles. Normal  respiratory effort. No accessory muscle use.  Cardiovascular: Regular rate and rhythm, no murmurs / rubs / gallops. No extremity edema. 2+ pedal pulses. No carotid bruits.  Chest - No reproducible chest wall tenderness Abdomen: no tenderness, no masses palpated. No hepatosplenomegaly. Bowel sounds positive.  Musculoskeletal: no clubbing / cyanosis. No joint deformity upper and lower extremities. Good ROM, no contractures. Normal muscle tone.  Skin: no rashes, lesions, ulcers. No induration Neurologic: CN 2-12 grossly intact. Sensation intact, DTR normal. Strength 5/5 in all 4.  Psychiatric: Normal judgment and insight. Alert and oriented x 3.Flat affect. Normal -Depressed.  Labs on Admission: I have personally reviewed following labs and imaging studies  CBC:  Recent Labs Lab 05/26/16 1720  WBC 6.6  HGB 14.5  HCT 44.3  MCV 84.9  PLT 130*   Basic Metabolic Panel:  Recent Labs Lab 05/26/16 1720  NA 140  K 3.4*  CL 105  CO2 28  GLUCOSE 80  BUN 16  CREATININE 1.09  CALCIUM 9.0   GFR: Estimated Creatinine Clearance: 75.7 mL/min (by C-G formula based on SCr of 1.09 mg/dL). Liver Function Tests:  Recent Labs Lab 05/26/16 1720  AST 27  ALT 22  ALKPHOS 74  BILITOT 1.3*  PROT 7.6  ALBUMIN 4.3   No results for input(s): LIPASE, AMYLASE in the last 168 hours. No results for input(s): AMMONIA in the last 168 hours. Coagulation Profile: No results for input(s): INR, PROTIME in the last 168 hours. Cardiac Enzymes:  Recent Labs Lab 05/26/16 1720  TROPONINI 0.03*   BNP (last 3 results) No results for input(s): PROBNP in the last 8760 hours. HbA1C: No results for input(s): HGBA1C in the last 72 hours. CBG: No results for input(s): GLUCAP in the last 168 hours. Lipid Profile: No results for input(s): CHOL, HDL, LDLCALC, TRIG, CHOLHDL, LDLDIRECT in the last 72 hours. Thyroid Function Tests: No results for input(s): TSH, T4TOTAL, FREET4, T3FREE, THYROIDAB in the last  72 hours. Anemia Panel: No results for input(s): VITAMINB12, FOLATE, FERRITIN, TIBC, IRON, RETICCTPCT in the last 72 hours. Urine analysis:    Component Value Date/Time   COLORURINE YELLOW 10/16/2014 2124   APPEARANCEUR CLOUDY (A) 10/16/2014 2124   LABSPEC 1.021 10/16/2014 2124   PHURINE 7.0 10/16/2014 2124   GLUCOSEU NEGATIVE 10/16/2014 2124   HGBUR NEGATIVE 10/16/2014 2124   BILIRUBINUR NEGATIVE 10/16/2014 2124   KETONESUR NEGATIVE 10/16/2014 2124   PROTEINUR NEGATIVE 10/16/2014 2124   UROBILINOGEN 1.0 10/16/2014 2124   NITRITE NEGATIVE 10/16/2014 2124   LEUKOCYTESUR NEGATIVE 10/16/2014 2124   Sepsis Labs: @LABRCNTIP (procalcitonin:4,lacticidven:4) )No results found for this or any previous visit (from the past 240 hour(s)).   Radiological Exams on Admission: Dg Chest 2 View  Result Date: 05/26/2016 CLINICAL DATA:  Shortness of breath with central chest pain and cough. EXAM: CHEST  2 VIEW COMPARISON:  12/11/2015 FINDINGS: The heart size and mediastinal contours  are within normal limits. Both lungs are clear. The visualized skeletal structures are unremarkable. IMPRESSION: No active cardiopulmonary disease. Electronically Signed   By: Kennith Center M.D.   On: 05/26/2016 17:03    EKG: Independently reviewed.   Assessment/Plan Principal Problem:   Pain in the chest Active Problems:   Suicidal ideation   Hypokalemia   Cocaine use disorder, severe, dependence (HCC)   Thrombocytopenia (HCC)   Chest pain  #1.  Chest pain: Atypical.   Marginally elevated troponin without acute ST-T wave changes in contiguous leads to support ACS -cycled cardiac enzymes and follow clinically.  #2.  Cocaine abuse:  Outpatient drug abuse counseling.  Patient has history of hypertension -avoid beta blockade  Due to history of cocaine abuse.    #3.  Suicidal ideation:  Psychiatry consult placed.  Continue bedside sitter until cleared by psychiatry  #4.  Mild hypokalemia and  thrombocytopenia:  Gentle potassium supplementation. Thrombocytopenia may be drug related-follow clinically as an outpatient if stable during hospitalization.    DVT prophylaxis: (Lovenox) Code Status: (Full) Family Communication: N/A   Disposition Plan: Home Consults called: N/A Admission status: Corliss Skains Gilford Rile MD [facility] Pager [phone]  If 7PM-7AM, please contact night-coverage [email or web address] Password [email or web address]  05/26/2016, 8:00 PM

## 2016-05-27 DIAGNOSIS — R072 Precordial pain: Secondary | ICD-10-CM

## 2016-05-27 DIAGNOSIS — R079 Chest pain, unspecified: Secondary | ICD-10-CM

## 2016-05-27 LAB — TROPONIN I: Troponin I: <0.03 ng/mL (ref <0.03)

## 2016-05-27 LAB — TSH: TSH: 7.055 u[IU]/mL — ABNORMAL HIGH (ref 0.350–4.500)

## 2016-05-27 MED ORDER — LORAZEPAM 2 MG/ML IJ SOLN: 1.0000 mg | Freq: Four times a day (QID) | INTRAMUSCULAR | Status: DC | PRN

## 2016-05-27 MED ORDER — ADULT MULTIVITAMIN W/MINERALS CH
1.0000 | ORAL_TABLET | Freq: Every day | ORAL | Status: DC
  Administered 2016-05-27 – 2016-06-01 (×6): 1 via ORAL
  Filled 2016-05-27 (×6): qty 1

## 2016-05-27 MED ORDER — POTASSIUM CHLORIDE CRYS ER 20 MEQ PO TBCR
40.0000 meq | EXTENDED_RELEASE_TABLET | Freq: Once | ORAL | Status: AC
  Administered 2016-05-27: 40 meq via ORAL
  Filled 2016-05-27: qty 2

## 2016-05-27 NOTE — Progress Notes (Signed)
This RN asked patient if he had any thoughts of harming himself, patient said "sometimes, they come and go." I then asked patient if he had any plans on how to hurt himself and patient said "I'm working on it." Suicide sitter at bedside. Will continue to monitor closely.

## 2016-05-27 NOTE — Progress Notes (Signed)
Initial Nutrition Assessment  DOCUMENTATION CODES:   Severe malnutrition in context of social or environmental circumstances  INTERVENTION:   -Encourage PO intake -Multivitamin with minerals daily -RD to continue to monitor  NUTRITION DIAGNOSIS:   Malnutrition related to social / environmental circumstances as evidenced by percent weight loss, energy intake < or equal to 50% for > or equal to 5 days.  GOAL:   Patient will meet greater than or equal to 90% of their needs  MONITOR:   PO intake, Labs, Weight trends, I & O's  REASON FOR ASSESSMENT:   Malnutrition Screening Tool    ASSESSMENT:   55 y.o. male with medical history Hypertension, previous stroke and cocaine abuse.  He presented to the ED with 2 day history mid-sternal chest pain described as Sharp aching pain, 7-8/10,  Lasting several hours, aggravated by standing outside in the heat and sometimes by exertion and sometimes not.   Patient in room with sitter at bedside. Pt reports not eating for 5 days PTA. The last meal he had was some fried chicken. He denies abdominal pain. Pt reports not having access to food most of the time. Pt is currently homeless. Pt was offered Ensure/Boost supplements but declined at this time. Encouraged pt to request items from unit if needed. Per weight history, pt has lost 30 lb since 3/1 (15% wt loss x 5 months, significant for time frame). Unable to perform NFPE at this time. Some depletion noticed in clavicle region.   Medications reviewed. Labs reviewed: Low K  Diet Order:  Diet Heart Room service appropriate? Yes; Fluid consistency: Thin  Skin:  Reviewed, no issues  Last BM:  8/16  Height:   Ht Readings from Last 1 Encounters:  05/26/16 5\' 9"  (1.753 m)    Weight:   Wt Readings from Last 1 Encounters:  05/26/16 170 lb (77.1 kg)    Ideal Body Weight:  72.7 kg  BMI:  Body mass index is 25.1 kg/m.  Estimated Nutritional Needs:   Kcal:  1950-2150  Protein:   105-115g  Fluid:  2.1L/day  EDUCATION NEEDS:   No education needs identified at this time  Tilda FrancoLindsey Jonael Paradiso, MS, RD, LDN Pager: [phone] After Hours Pager: [phone]

## 2016-05-27 NOTE — Progress Notes (Signed)
PROGRESS NOTE    Gene Garcia  JXB:[id number] DOB: [birth year] DOA: 05/26/2016 PCP: Ambrose FinlandValerie A [name], NP   Brief Narrative: Gene Garcia is a 55 y.o. male with medical history Hypertension, previous stroke and cocaine abuse.  He presented to the ED with 2 day history mid-sternal chest pain described as Sharp aching pain, 7-8/10,  Lasting several hours, aggravated by standing outside in the heat and sometimes by exertion and sometimes not.  No known alleviating factor.  Denies shortness of breath, nausea, vomiting, diaphoresis, sputum production, No fever or chills, but admits to some intermittent dry cough.  The patient also has history of depression and PTSD and has been recently suicidal.   Assessment & Plan:   Principal Problem:   Pain in the chest Active Problems:   Suicidal ideation   Cocaine use disorder, severe, dependence (HCC)   Hypokalemia   Thrombocytopenia (HCC)   Chest pain  1-Chest pain; troponin negative. Continue to cycle.  Check [employer] Ativan PRN for chest pain, recent drug use.   2-Depression, suicidal thought; continue with sitter. Psych consulted.   3-Cocaine use; SW consulted.  4-mild thrombocytopenia; follow trend.    DVT prophylaxis: Lovenox Code Status: Full Code.  Family Communication: care discussed with patient  Disposition Plan: remain inpatient.    Consultants:   Psych    Procedures:  none   Antimicrobials:   none   Subjective: He is feeling better, denies chest pain at this time .  Report chest pain on and off.   Objective: Vitals:   05/26/16 2308 05/27/16 0518 05/27/16 1229 05/27/16 1401  BP: 122/78 (!) 141/86 (!) 155/89 134/72  Pulse: (!) 58 (!) 56 (!) 57 64  Resp: 18 18 20 20   Temp: 97.8 F (36.6 C) 97.6 F (36.4 C) 97.9 F (36.6 C) 98.8 F (37.1 C)  TempSrc: Oral Oral Oral Oral  SpO2: 100% 100% 100% 100%  Weight:      Height:        Intake/Output Summary (Last 24 hours) at 05/27/16 1737 Last data filed at  05/26/16 2336  Gross per 24 hour  Intake              240 ml  Output              200 ml  Net               40 ml   Filed Weights   05/26/16 1611  Weight: 77.1 kg (170 lb)    Examination:  General exam: Appears calm and comfortable  Respiratory system: Clear to auscultation. Respiratory effort normal. Cardiovascular system: S1 & S2 heard, RRR. No JVD, murmurs, rubs, gallops or clicks. No pedal edema. Gastrointestinal system: Abdomen is nondistended, soft and nontender. No organomegaly or masses felt. Normal bowel sounds heard. Central nervous system: Alert and oriented. No focal neurological deficits. Extremities: Symmetric 5 x 5 power. Skin: No rashes, lesions or ulcers Psychiatry: Judgement and insight appear normal. Mood & affect appropriate.     Data Reviewed: I have personally reviewed following labs and imaging studies  CBC:  Recent Labs Lab 05/26/16 1720  WBC 6.6  HGB 14.5  HCT 44.3  MCV 84.9  PLT 130*   Basic Metabolic Panel:  Recent Labs Lab 05/26/16 1720  NA 140  K 3.4*  CL 105  CO2 28  GLUCOSE 80  BUN 16  CREATININE 1.09  CALCIUM 9.0   GFR: Estimated Creatinine Clearance: 75.7 mL/min (by C-G formula based on SCr of  1.09 mg/dL). Liver Function Tests:  Recent Labs Lab 05/26/16 1720  AST 27  ALT 22  ALKPHOS 74  BILITOT 1.3*  PROT 7.6  ALBUMIN 4.3   No results for input(s): LIPASE, AMYLASE in the last 168 hours. No results for input(s): AMMONIA in the last 168 hours. Coagulation Profile: No results for input(s): INR, PROTIME in the last 168 hours. Cardiac Enzymes:  Recent Labs Lab 05/26/16 1720 05/27/16 0820 05/27/16 1415  TROPONINI 0.03* <0.03 <0.03   BNP (last 3 results) No results for input(s): PROBNP in the last 8760 hours. HbA1C: No results for input(s): HGBA1C in the last 72 hours. CBG: No results for input(s): GLUCAP in the last 168 hours. Lipid Profile: No results for input(s): CHOL, HDL, LDLCALC, TRIG, CHOLHDL,  LDLDIRECT in the last 72 hours. Thyroid Function Tests:  Recent Labs  05/26/16 2313  TSH 7.055*   Anemia Panel: No results for input(s): VITAMINB12, FOLATE, FERRITIN, TIBC, IRON, RETICCTPCT in the last 72 hours. Sepsis Labs: No results for input(s): PROCALCITON, LATICACIDVEN in the last 168 hours.  No results found for this or any previous visit (from the past 240 hour(s)).       Radiology Studies: Dg Chest 2 View  Result Date: 05/26/2016 CLINICAL DATA:  Shortness of breath with central chest pain and cough. EXAM: CHEST  2 VIEW COMPARISON:  12/11/2015 FINDINGS: The heart size and mediastinal contours are within normal limits. Both lungs are clear. The visualized skeletal structures are unremarkable. IMPRESSION: No active cardiopulmonary disease. Electronically Signed   By: Kennith CenterEric  Mansell M.D.   On: 05/26/2016 17:03        Scheduled Meds: . amLODipine  10 mg Oral Daily  . aspirin EC  325 mg Oral Daily  . enoxaparin (LOVENOX) injection  40 mg Subcutaneous Q24H  . levothyroxine  50 mcg Oral QAC breakfast  . lisinopril  5 mg Oral Daily  . multivitamin with minerals  1 tablet Oral Daily  . sertraline  50 mg Oral Daily   Continuous Infusions:    LOS: 0 days    Time spent: 35 minutes.     Alba Coryegalado, Masae Lukacs A, MD [facility] Pager [phone]  If 7PM-7AM, please contact night-coverage [email or web address] Password [email or web address] 05/27/2016, 5:37 PM

## 2016-05-28 ENCOUNTER — Observation Stay (HOSPITAL_BASED_OUTPATIENT_CLINIC_OR_DEPARTMENT_OTHER): Payer: Self-pay

## 2016-05-28 DIAGNOSIS — F329 Major depressive disorder, single episode, unspecified: Secondary | ICD-10-CM

## 2016-05-28 DIAGNOSIS — E876 Hypokalemia: Secondary | ICD-10-CM

## 2016-05-28 DIAGNOSIS — R079 Chest pain, unspecified: Secondary | ICD-10-CM

## 2016-05-28 DIAGNOSIS — R45851 Suicidal ideations: Secondary | ICD-10-CM

## 2016-05-28 LAB — ECHOCARDIOGRAM COMPLETE
E decel time: 278 msec
E/e' ratio: 5.63
FS: 28 % (ref 28–44)
HEIGHTINCHES: 69 in
IV/PV OW: 1.12
LA vol: 62.1 mL
LADIAMINDEX: 1.87 cm/m2
LASIZE: 36 mm
LAVOLA4C: 51.5 mL
LAVOLIN: 32.2 mL/m2
LEFT ATRIUM END SYS DIAM: 36 mm
LV E/e'average: 5.63
LV PW d: 13.8 mm — AB (ref 0.6–1.1)
LV TDI E'MEDIAL: 7.94
LVEEMED: 5.63
LVELAT: 10.6 cm/s
LVOT area: 3.14 cm2
LVOT diameter: 20 mm
MV Dec: 278
MV pk E vel: 59.7 m/s
MVPKAVEL: 51.9 m/s
TDI e' lateral: 10.6
WEIGHTICAEL: 2720 [oz_av]

## 2016-05-28 LAB — BASIC METABOLIC PANEL
ANION GAP: 6 (ref 5–15)
BUN: 16 mg/dL (ref 6–20)
CALCIUM: 8.4 mg/dL — AB (ref 8.9–10.3)
CO2: 25 mmol/L (ref 22–32)
CREATININE: 0.99 mg/dL (ref 0.61–1.24)
Chloride: 109 mmol/L (ref 101–111)
GLUCOSE: 130 mg/dL — AB (ref 65–99)
Potassium: 3.2 mmol/L — ABNORMAL LOW (ref 3.5–5.1)
Sodium: 140 mmol/L (ref 135–145)

## 2016-05-28 LAB — T4, FREE: FREE T4: 0.54 ng/dL — AB (ref 0.61–1.12)

## 2016-05-28 MED ORDER — PANTOPRAZOLE SODIUM 40 MG PO TBEC
40.0000 mg | DELAYED_RELEASE_TABLET | Freq: Two times a day (BID) | ORAL | Status: DC
  Administered 2016-05-28 – 2016-06-01 (×9): 40 mg via ORAL
  Filled 2016-05-28 (×9): qty 1

## 2016-05-28 NOTE — Clinical Social Work Psych Assess (Signed)
Clinical Social Work Nature conservation officer  Clinical Social Worker:  Lia Hopping, LCSW Date/Time:  05/28/2016, 3:33 PM Referred By:  Clinical Social Work Date Referred:  05/28/16 Reason for Referral:  Behavioral Health Issues, Substance Abuse   Presenting Symptoms/Problems  Presenting Symptoms/Problems(in person's/family's own words):  "I am depressed, I have been depressed for the last three years." Patient has medical history Hypertension, previous stroke and cocaine abuse.  He presented to the ED with 2 day history mid-sternal chest pain described as sharp pain aching.    Abuse/Neglect/Trauma History  Abuse/Neglect/Trauma History:  Denies History Abuse/Neglect/Trauma History Comments (indicate dates):  None reported.    Psychiatric History  Psychiatric History:  [facility]) Psychiatric Medication: Patient reports taking Zoloft but has not taken the medication "in a good while", two years.    Current Mental Health Hospitalizations/Previous Mental Health History:  Patient has HX of [facility], December 2016.    Current Provider:  Previously-[facility]. Patient does not currently have a provider.  Place and Date:  N/A  Current Medications:   Medications 05/28/16 05/29/16 05/30/16 05/31/16 06/01/16 06/02/16 06/03/16  amLODipine (NORVASC) tablet 10 mg Dose: 10 mg Freq: Daily Route: PO Indications of Use: HYPERTENSION Start: 05/27/16 1000   0936    1000    1000    1000    1000    1000    1000     aspirin EC tablet 325 mg Dose: 325 mg Freq: Daily Route: PO Start: 05/26/16 2200   0936    1000    1000    1000    1000    1000    1000     enoxaparin (LOVENOX) injection 40 mg Dose: 40 mg Freq: Every 24 hours Route: Leon Start: 05/26/16 2200   Admin Instructions:  Do NOT expel air bubble from syringe before giving.   2200    2200    2200    2200    2200    2200    2200     levothyroxine (SYNTHROID, LEVOTHROID)  tablet 50 mcg Dose: 50 mcg Freq: Daily before breakfast Route: PO Indications of Use: HYPOTHYROIDISM Start: 05/27/16 0800   Admin Instructions:  Give on an empty stomach, at least 30 minutes before eating.   0803    0800    0800    0800    0800    0800    0800     lisinopril (PRINIVIL,ZESTRIL) tablet 5 mg Dose: 5 mg Freq: Daily Route: PO Start: 05/27/16 1000   0936    1000    1000    1000    1000    1000    1000     multivitamin with minerals tablet 1 tablet Dose: 1 tablet Freq: Daily Route: PO Start: 05/27/16 1600   0936    1000    1000    1000    1000    1000    1000     pantoprazole (PROTONIX) EC tablet 40 mg Dose: 40 mg Freq: 2 times daily Route: PO Start: 05/28/16 1500   1500   2200    1000   2200    1000   2200    1000   2200    1000   2200    1000   2200    1000   2200     sertraline (ZOLOFT) tablet 50 mg Dose: 50 mg Freq: Daily Route: PO Indications of Use: MAJOR DEPRESSIVE  DISORDER Start: 05/26/16 2200   0936    1000    1000    1000    1000    1000    1000     Medications 05/28/16 05/29/16 05/30/16          Previous Inpatient Admission/Date/Reason:  N/A   Emotional Health/Current Symptoms  Suicide/Self Harm: Suicidal Ideation - " I have thoughts about killing myself."   Suicide Attempt in Past (date/description):  Patient denies suicidal attempt.   Other Harmful Behavior (ex. homicidal ideation) (describe):  Patient denies homicidal ideations.    Psychotic/Dissociative Symptoms  Psychotic/Dissociative Symptoms: None Reported Other Psychotic/Dissociative Symptoms:  Patient denies visual and auditory hallucinations.   Attention/Behavioral Symptoms  Attention/Behavioral Symptoms: Within Normal Limits Other Attention/Behavioral Symptoms: Patient expresses no purpose in life and often does not engage socially with other. Reports no hobbies and interest in activities.     Cognitive Impairment  Cognitive Impairment:  Orientation - Self, Orientation - Situation, Orientation - Time, Orientation - Place Other Cognitive Impairment: Patient alert and oriented.    Mood and Adjustment  Mood and Adjustment:  Depression   Stress, Anxiety, Trauma, Any Recent Loss/Stressor  Stress, Anxiety, Trauma, Any Recent Loss/Stressor: Relationship, Grief/Loss (recent or history): Patient reports all of his family members are dead except his two children.  Anxiety (frequency): Patient feels anxiety from stressors of homelessness. Frequently.   Phobia (specify): n/a  Compulsive Behavior (specify):  n/a  Obsessive Behavior (specify):  n/a  Other Stress, Anxiety, Trauma, Any Recent Loss/Stressor:  Patient reports he does not have relationship with his children, therefore feels lonely and depressed. Patient reports he is unemployed and it is difficult find a job. Patient he has limited education and struggles to read.    Substance Abuse/Use  Substance Abuse/Use: Current Substance Use SBIRT Completed (please refer for detailed history): No Self-reported Substance Use (last use and frequency):  Patient reports he smokes crack cocaine, and drinks six pack beers. Patient reports last drank and smoke 4-5 days ago.   Urinary Drug Screen Completed: Yes Alcohol Level:  Less than 5   Environment/Housing/Living Arrangement  Environmental/Housing/Living Arrangement: Homeless Who is in the Home:  Homeless  Emergency Contact:  No contacts   Financial  Financial: Medicaid   Patient's Strengths and Goals  Patient's Strengths and Goals (patient's own words):  "The medication (Zoloft) has helped me."   Clinical Social Worker's Interpretive Summary  Clinical Social Workers Interpretive Summary:  Development worker, international aid, Psychiatrist MD met with patient at bedside. Patient reports he is from [place] and has been living in [place] for the past three years. Patient reports he has  been homeless for the three years and using cocaine (crack). Patient reports feeling lonely, depressed and unable to a manage his life. " I live in the streets, I find a temporary job and work day by day and live in a shelter."Patient reports  health issues with high blood pressure and thyroids.  Patient reports he used [facility] services in the past but has not recently."They just want to talk and send me out with a pill." Patient reports he was prescribed Zoloft but has not taken it two years. " I have been homeless, and not thinking about taking medication.  Patient reports he does not have a support system. All family members are dead, and he has two adult children that do not want a relationship with him.  Patient denies he has ever made a suicide attempt, and never had a plan. Patient reports "  I am scared to live and scared to die."    Disposition  Disposition:  Psychiatrist recommendations.

## 2016-05-28 NOTE — Progress Notes (Signed)
PROGRESS NOTE    Gene Garcia  ZOX:[id number] DOB: 07-12-1961 DOA: 05/26/2016 PCP: Ambrose FinlandValerie A [name], NP   Brief Narrative: Gene Garcia is a 55 y.o. male with medical history Hypertension, previous stroke and cocaine abuse.  He presented to the ED with 2 day history mid-sternal chest pain described as Sharp aching pain, 7-8/10,  Lasting several hours, aggravated by standing outside in the heat and sometimes by exertion and sometimes not.  No known alleviating factor.  Denies shortness of breath, nausea, vomiting, diaphoresis, sputum production, No fever or chills, but admits to some intermittent dry cough.  The patient also has history of depression and PTSD and has been recently suicidal.   Assessment & Plan:   Principal Problem:   Pain in the chest Active Problems:   Suicidal ideation   Cocaine use disorder, severe, dependence (HCC)   Hypokalemia   Thrombocytopenia (HCC)   Chest pain  1-Chest pain; troponin negative times 3.  ECHO normal.  Ativan PRN for chest pain, recent drug use.  Start protonix   2-Depression, suicidal thought; continue with sitter. Psych consulted.   3-Cocaine use; SW consulted.  4-mild thrombocytopenia; follow trend.  5-Elevated TSH, low Free T 4. Await Free T 3 to determine need for synthroid.    DVT prophylaxis: Lovenox Code Status: Full Code.  Family Communication: care discussed with patient  Disposition Plan: remain inpatient.    Consultants:   Psych    Procedures:  ECHO normal.    Antimicrobials:   none   Subjective: He was not able to sleep last night.  Had one episode of chest pain, felt like gas.    Objective: Vitals:   05/27/16 1952 05/28/16 0659 05/28/16 0711 05/28/16 1421  BP: (!) 160/92 (!) 157/100 (!) 142/84 (!) 152/84  Pulse: 65 60  60  Resp: 20 18  18   Temp: 99 F (37.2 C) 98.9 F (37.2 C)  98.8 F (37.1 C)  TempSrc: Oral Oral  Oral  SpO2: 100% 100%    Weight:      Height:        Intake/Output Summary  (Last 24 hours) at 05/28/16 1423 Last data filed at 05/28/16 1253  Gross per 24 hour  Intake              840 ml  Output                0 ml  Net              840 ml   Filed Weights   05/26/16 1611  Weight: 77.1 kg (170 lb)    Examination:  General exam: Appears calm and comfortable  Respiratory system: Clear to auscultation. Respiratory effort normal. Cardiovascular system: S1 & S2 heard, RRR. No JVD, murmurs, rubs, gallops or clicks. No pedal edema. Gastrointestinal system: Abdomen is nondistended, soft and nontender. No organomegaly or masses felt. Normal bowel sounds heard. Central nervous system: Alert and oriented. No focal neurological deficits. Extremities: Symmetric 5 x 5 power. Skin: No rashes, lesions or ulcers Psychiatry: Judgement and insight appear normal. Mood & affect appropriate.     Data Reviewed: I have personally reviewed following labs and imaging studies  CBC:  Recent Labs Lab 05/26/16 1720  WBC 6.6  HGB 14.5  HCT 44.3  MCV 84.9  PLT 130*   Basic Metabolic Panel:  Recent Labs Lab 05/26/16 1720 05/28/16 0511  NA 140 140  K 3.4* 3.2*  CL 105 109  CO2 28 25  GLUCOSE  80 130*  BUN 16 16  CREATININE 1.09 0.99  CALCIUM 9.0 8.4*   GFR: Estimated Creatinine Clearance: 83.3 mL/min (by C-G formula based on SCr of 0.99 mg/dL). Liver Function Tests:  Recent Labs Lab 05/26/16 1720  AST 27  ALT 22  ALKPHOS 74  BILITOT 1.3*  PROT 7.6  ALBUMIN 4.3   No results for input(s): LIPASE, AMYLASE in the last 168 hours. No results for input(s): AMMONIA in the last 168 hours. Coagulation Profile: No results for input(s): INR, PROTIME in the last 168 hours. Cardiac Enzymes:  Recent Labs Lab 05/26/16 1720 05/27/16 0820 05/27/16 1415 05/27/16 2007  TROPONINI 0.03* <0.03 <0.03 <0.03   BNP (last 3 results) No results for input(s): PROBNP in the last 8760 hours. HbA1C: No results for input(s): HGBA1C in the last 72 hours. CBG: No results for  input(s): GLUCAP in the last 168 hours. Lipid Profile: No results for input(s): CHOL, HDL, LDLCALC, TRIG, CHOLHDL, LDLDIRECT in the last 72 hours. Thyroid Function Tests:  Recent Labs  05/26/16 2313 05/27/16 2007  TSH 7.055*  --   FREET4  --  0.54*   Anemia Panel: No results for input(s): VITAMINB12, FOLATE, FERRITIN, TIBC, IRON, RETICCTPCT in the last 72 hours. Sepsis Labs: No results for input(s): PROCALCITON, LATICACIDVEN in the last 168 hours.  No results found for this or any previous visit (from the past 240 hour(s)).       Radiology Studies: Dg Chest 2 View  Result Date: 05/26/2016 CLINICAL DATA:  Shortness of breath with central chest pain and cough. EXAM: CHEST  2 VIEW COMPARISON:  12/11/2015 FINDINGS: The heart size and mediastinal contours are within normal limits. Both lungs are clear. The visualized skeletal structures are unremarkable. IMPRESSION: No active cardiopulmonary disease. Electronically Signed   By: Kennith CenterEric  Mansell M.D.   On: 05/26/2016 17:03        Scheduled Meds: . amLODipine  10 mg Oral Daily  . aspirin EC  325 mg Oral Daily  . enoxaparin (LOVENOX) injection  40 mg Subcutaneous Q24H  . levothyroxine  50 mcg Oral QAC breakfast  . lisinopril  5 mg Oral Daily  . multivitamin with minerals  1 tablet Oral Daily  . pantoprazole  40 mg Oral BID  . sertraline  50 mg Oral Daily   Continuous Infusions:    LOS: 0 days    Time spent: 35 minutes.     Alba Coryegalado, Emmanual Gauthreaux A, MD [facility] Pager [phone]  If 7PM-7AM, please contact night-coverage [email or web address] Password [email or web address] 05/28/2016, 2:23 PM

## 2016-05-28 NOTE — Progress Notes (Signed)
Upon assessment patient was asked by this Rn if he still have thoughts  Of harming himself and patient answered " yea", . Suicide sitter at bedside, will monitor accordingly.

## 2016-05-28 NOTE — Consult Note (Signed)
[place] Psychiat[facility] Consult   Reason for Consult:  Depression, cocaine intoxication and suicidal ideation Referring Physician:  Dr. Tyrell Antonio Patient Identification: Gene Garcia MRN:  [id number] Principal Diagnosis: Pain in the chest Diagnosis:   Patient Active Problem List   Diagnosis Date Noted  . Pain in the chest [R07.9] 05/26/2016  . Thrombocytopenia ([place]) [D69.6] 05/26/2016  . Chest pain [R07.9] 05/26/2016  . Cocaine abuse [F14.10]   . Cocaine use disorder, severe, dependence ([place]) [F14.20] 10/06/2015  . Substance or medication-induced depressive disorder with onset during withdrawal ([place]) [F19.94] 10/06/2015  . Substance or medication-induced anxiety disorder ([place]) [F19.980] 10/06/2015  . Hypokalemia [E87.6] 10/06/2015  . Suicidal ideation [R45.851]   . Unintended weight loss [R63.4] 04/09/2015  . HTN (hypertension) [I10] 11/21/2014  . History of stroke [Z86.73] 11/21/2014  . History of thyroid disease [Z86.39] 11/21/2014    Total Time spent with patient: 1 hour  Subjective:   Gene Garcia is a 55 y.o. male patient admitted with chest pain, cocaine intoxication, depression and suicidal ideation  HPI:  Gene Garcia is a 55 years old male admitted to [facility] with recent onset of chest pain, cocaine intoxication, depression and suicidal ideation. Patient has been suffering from hypertension and hypothyroidism but does not receive any medication management secondary to psychosocial stresses and access of medical care in mental health care. Patient reportedly suffering with depression and substance abuse over 3 years. Patient reported he has no family members, no friends, no psychosocial support: No job, no education and has been living as a homeless in [place] over 3 years. Patient reportedly came from [place] to change the environment but nothing helped. Patient reported he has a suicidal thoughts because he cannot go and get any help from  anybody, he stopped seeing his psychiatric services at [facility] and also stopped taking his medication. Patient reported he has a suicidal plans of jumping in front of the traffic or intentional overdose of medication but is scared to excrete his suicidal plan. Patient endorses working here and there temporarily and using that dollars for drinking alcohol or abusing cocaine. Patient urine drug screen is positive for cocaine and blood alcohol level is not significant. Patient cannot contract for safety and risk criteria for inpatient hospitalization.  Past Psychiatric History: Significant for substance induced mood disorder and multiple psychosocial stresses Patient has previous acute psychiatric hospitalization at behavioral [facility] December 2016 for similar clinical presentation.  Risk to Self: Is patient at risk for suicide?: Yes Risk to Others:   Prior Inpatient Therapy:   Prior Outpatient Therapy:    Past Medical History:  Past Medical History:  Diagnosis Date  . Asthma   . Hypertension   . Hypothyroidism   . Thyroid disease    History reviewed. No pertinent surgical history. Family History:  Family History  Problem Relation Age of Onset  . Diabetes Maternal Aunt    Family Psychiatric  History: Patient has no family history of mental illness. Social History:  History  Alcohol Use No     History  Drug Use  . Types: "Crack" cocaine    Comment: pt states no drugs in 1 year    Social History   Social History  . Marital status: Single    Spouse name: N/A  . Number of children: N/A  . Years of education: N/A   Social History Main Topics  . Smoking status: Never Smoker  . Smokeless tobacco: Never Used  . Alcohol use No  .  Drug use:     Types: "Crack" cocaine     Comment: pt states no drugs in 1 year  . Sexual activity: Not Asked   Other Topics Concern  . None   Social History Narrative  . None   Additional Social History:    Allergies:  No Known  Allergies  Labs:  Results for orders placed or performed during the hospital encounter of 05/26/16 (from the past 48 hour(s))  Comprehensive metabolic panel     Status: Abnormal   Collection Time: 05/26/16  5:20 PM  Result Value Ref Range   Sodium 140 135 - 145 mmol/L   Potassium 3.4 (L) 3.5 - 5.1 mmol/L   Chloride 105 101 - 111 mmol/L   CO2 28 22 - 32 mmol/L   Glucose, Bld 80 65 - 99 mg/dL   BUN 16 6 - 20 mg/dL   Creatinine, Ser 1.09 0.61 - 1.24 mg/dL   Calcium 9.0 8.9 - 10.3 mg/dL   Total Protein 7.6 6.5 - 8.1 g/dL   Albumin 4.3 3.5 - 5.0 g/dL   AST 27 15 - 41 U/L   ALT 22 17 - 63 U/L   Alkaline Phosphatase 74 38 - 126 U/L   Total Bilirubin 1.3 (H) 0.3 - 1.2 mg/dL   GFR calc non Af Amer >60 >60 mL/min   GFR calc Af Amer >60 >60 mL/min    Comment: (NOTE) The eGFR has been calculated using the CKD EPI equation. This calculation has not been validated in all clinical situations. eGFR's persistently <60 mL/min signify possible Chronic Kidney Disease.    Anion gap 7 5 - 15  Ethanol     Status: None   Collection Time: 05/26/16  5:20 PM  Result Value Ref Range   Alcohol, Ethyl (B) <5 <5 mg/dL    Comment:        LOWEST DETECTABLE LIMIT FOR SERUM ALCOHOL IS 5 mg/dL FOR MEDICAL PURPOSES ONLY   Salicylate level     Status: None   Collection Time: 05/26/16  5:20 PM  Result Value Ref Range   Salicylate Lvl <0.0 2.8 - 30.0 mg/dL  Acetaminophen level     Status: Abnormal   Collection Time: 05/26/16  5:20 PM  Result Value Ref Range   Acetaminophen (Tylenol), Serum <10 (L) 10 - 30 ug/mL    Comment:        THERAPEUTIC CONCENTRATIONS VARY SIGNIFICANTLY. A RANGE OF 10-30 ug/mL MAY BE AN EFFECTIVE CONCENTRATION FOR MANY PATIENTS. HOWEVER, SOME ARE BEST TREATED AT CONCENTRATIONS OUTSIDE THIS RANGE. ACETAMINOPHEN CONCENTRATIONS >150 ug/mL AT 4 HOURS AFTER INGESTION AND >50 ug/mL AT 12 HOURS AFTER INGESTION ARE OFTEN ASSOCIATED WITH TOXIC REACTIONS.   cbc     Status: Abnormal    Collection Time: 05/26/16  5:20 PM  Result Value Ref Range   WBC 6.6 4.0 - 10.5 K/uL   RBC 5.22 4.22 - 5.81 MIL/uL   Hemoglobin 14.5 13.0 - 17.0 g/dL   HCT 44.3 39.0 - 52.0 %   MCV 84.9 78.0 - 100.0 fL   MCH 27.8 26.0 - 34.0 pg   MCHC 32.7 30.0 - 36.0 g/dL   RDW 14.3 11.5 - 15.5 %   Platelets 130 (L) 150 - 400 K/uL  Troponin I     Status: Abnormal   Collection Time: 05/26/16  5:20 PM  Result Value Ref Range   Troponin I 0.03 (HH) <0.03 ng/mL    Comment: CRITICAL RESULT CALLED TO, READ BACK BY AND VERIFIED WITH:  LYNARD,S AT 1810 ON 05/26/16 BY MOSLEY,J   TSH     Status: Abnormal   Collection Time: 05/26/16 11:13 PM  Result Value Ref Range   TSH 7.055 (H) 0.350 - 4.500 uIU/mL  Troponin I (q 6hr x 3)     Status: None   Collection Time: 05/27/16  8:20 AM  Result Value Ref Range   Troponin I <0.03 <0.03 ng/mL  Troponin I (q 6hr x 3)     Status: None   Collection Time: 05/27/16  2:15 PM  Result Value Ref Range   Troponin I <0.03 <0.03 ng/mL  Troponin I (q 6hr x 3)     Status: None   Collection Time: 05/27/16  8:07 PM  Result Value Ref Range   Troponin I <0.03 <0.03 ng/mL  T4, free     Status: Abnormal   Collection Time: 05/27/16  8:07 PM  Result Value Ref Range   Free T4 0.54 (L) 0.61 - 1.12 ng/dL    Comment: (NOTE) Biotin ingestion may interfere with free T4 tests. If the results are inconsistent with the TSH level, previous test results, or the clinical presentation, then consider biotin interference. If needed, order repeat testing after stopping biotin. Performed at [facility] metabolic panel     Status: Abnormal   Collection Time: 05/28/16  5:11 AM  Result Value Ref Range   Sodium 140 135 - 145 mmol/L   Potassium 3.2 (L) 3.5 - 5.1 mmol/L   Chloride 109 101 - 111 mmol/L   CO2 25 22 - 32 mmol/L   Glucose, Bld 130 (H) 65 - 99 mg/dL   BUN 16 6 - 20 mg/dL   Creatinine, Ser 0.99 0.61 - 1.24 mg/dL   Calcium 8.4 (L) 8.9 - 10.3 mg/dL   GFR calc non Af  Amer >60 >60 mL/min   GFR calc Af Amer >60 >60 mL/min    Comment: (NOTE) The eGFR has been calculated using the CKD EPI equation. This calculation has not been validated in all clinical situations. eGFR's persistently <60 mL/min signify possible Chronic Kidney Disease.    Anion gap 6 5 - 15    Current Facility-Administered Medications  Medication Dose Route Frequency Provider Last Rate Last Dose  . acetaminophen (TYLENOL) tablet 650 mg  650 mg Oral Q4H PRN Benito Mccreedy, MD      . amLODipine (NORVASC) tablet 10 mg  10 mg Oral Daily Benito Mccreedy, MD   10 mg at 05/28/16 0936  . aspirin EC tablet 325 mg  325 mg Oral Daily Benito Mccreedy, MD   325 mg at 05/28/16 0936  . enoxaparin (LOVENOX) injection 40 mg  40 mg Subcutaneous Q24H Benito Mccreedy, MD   40 mg at 05/27/16 2137  . gi cocktail (Maalox,Lidocaine,Donnatal)  30 mL Oral QID PRN Benito Mccreedy, MD      . hydrOXYzine (ATARAX/VISTARIL) tablet 25 mg  25 mg Oral Q6H PRN Benito Mccreedy, MD      . levothyroxine (SYNTHROID, LEVOTHROID) tablet 50 mcg  50 mcg Oral QAC breakfast Benito Mccreedy, MD   50 mcg at 05/28/16 0803  . lisinopril (PRINIVIL,ZESTRIL) tablet 5 mg  5 mg Oral Daily Benito Mccreedy, MD   5 mg at 05/28/16 0936  . LORazepam (ATIVAN) injection 1 mg  1 mg Intravenous Q6H PRN Belkys A Regalado, MD      . morphine 2 MG/ML injection 2 mg  2 mg Intravenous Q2H PRN Benito Mccreedy, MD      .  multivitamin with minerals tablet 1 tablet  1 tablet Oral Daily Belkys A Regalado, MD   1 tablet at 05/28/16 0936  . ondansetron (ZOFRAN) injection 4 mg  4 mg Intravenous Q6H PRN Benito Mccreedy, MD      . pantoprazole (PROTONIX) EC tablet 40 mg  40 mg Oral BID Belkys A Regalado, MD   40 mg at 05/28/16 1623  . sertraline (ZOLOFT) tablet 50 mg  50 mg Oral Daily Benito Mccreedy, MD   50 mg at 05/28/16 0936  . traZODone (DESYREL) tablet 150 mg  150 mg Oral QHS PRN Benito Mccreedy, MD        Musculoskeletal: Strength  & Muscle Tone: within normal limits Gait & Station: normal Patient leans: N/A  Psychiatric Specialty Exam: Physical Exam as per history and physical   ROS generalized weakness, depression, insomnia, chest pain No Fever-chills, No Headache, No changes with Vision or hearing, reports vertigo No problems swallowing food or Liquids, No Chest pain, Cough or Shortness of Breath, No Abdominal pain, No Nausea or Vommitting, Bowel movements are regular, No Blood in stool or Urine, No dysuria, No new skin rashes or bruises, No new joints pains-aches,  No new weakness, tingling, numbness in any extremity, No recent weight gain or loss, No polyuria, polydypsia or polyphagia,   A full 10 point Review of Systems was done, except as stated above, all other Review of Systems were negative.  Blood pressure (!) 152/84, pulse 60, temperature 98.8 F (37.1 C), temperature source Oral, resp. rate 18, height _0  (1.753 m), weight 77.1 kg (170 lb), SpO2 100 %.Body mass index is 25.1 kg/m.  General Appearance: Negative and Guarded  Eye Contact:  Good  Speech:  Clear and Coherent  Volume:  Decreased  Mood:  Depressed, Hopeless and Worthless  Affect:  Constricted and Depressed  Thought Process:  Coherent and Goal Directed  Orientation:  Full (Time, Place, and Person)  Thought Content:  WDL  Suicidal Thoughts:  Yes.  without intent/plan  Homicidal Thoughts:  No  Memory:  Immediate;   Good Recent;   Fair Remote;   Fair  Judgement:  Impaired  Insight:  Fair  Psychomotor Activity:  Decreased  Concentration:  Concentration: Fair and Attention Span: Fair  Recall:  Good  Fund of Knowledge:  Good  Language:  Good  Akathisia:  Negative  Handed:  Right  AIMS (if indicated):     Assets:  Communication Skills Desire for Improvement Leisure Time Resilience  ADL's:  Intact  Cognition:  WNL  Sleep:        Treatment Plan Summary: Patient presented with chest pain and cocaine intoxication and  reportedly been depressed and has suicidal ideation without specific intent or plan. Patient has previous history of acute psychiatric hospitalization December 2016 for similar clinical presentation. Patient meet criteria for acute psychiatric hospitalization as he has limited psychosocial support and poor coping skills and has no previous substance abuse treatment. Patient cannot contract for safety during this evaluation.  Safety concern: Patient has suicidal ideation and cannot contract for safety at this time  Substance-induced mood disorder: Will restart Zoloft 50 mg daily for depression and trazodone 150 mg at bedtime for insomnia  Recommended appropriate medication management for his hypertension and hypothyroidism  Multiple psychosocial stressors: Referred to the unit social service and case management  Appreciate psychiatric consultation and follow up as clinically required Please contact [phone] or [phone] if needs further assistance   Disposition: Recommend psychiatric Inpatient admission when  medically cleared. Supportive therapy provided about ongoing stressors.  Ambrose Finland, MD 05/28/2016 5:08 PM is

## 2016-05-28 NOTE — Progress Notes (Signed)
   Echocardiogram 2D Echocardiogram has been performed.  Cathie BeamsGREGORY, Kailea Dannemiller 05/28/2016, 12:29 PM

## 2016-05-29 LAB — BASIC METABOLIC PANEL
Anion gap: 4 — ABNORMAL LOW (ref 5–15)
BUN: 14 mg/dL (ref 6–20)
CHLORIDE: 106 mmol/L (ref 101–111)
CO2: 31 mmol/L (ref 22–32)
CREATININE: 0.93 mg/dL (ref 0.61–1.24)
Calcium: 8.8 mg/dL — ABNORMAL LOW (ref 8.9–10.3)
GFR calc Af Amer: >60 mL/min (ref >60)
GFR calc non Af Amer: >60 mL/min (ref >60)
Glucose, Bld: 80 mg/dL (ref 65–99)
POTASSIUM: 4.4 mmol/L (ref 3.5–5.1)
Sodium: 141 mmol/L (ref 135–145)

## 2016-05-29 LAB — T3, FREE: T3, Free: 1.9 pg/mL — ABNORMAL LOW (ref 2.0–4.4)

## 2016-05-29 MED ORDER — LISINOPRIL 10 MG PO TABS
10.0000 mg | ORAL_TABLET | Freq: Every day | ORAL | Status: DC
  Administered 2016-05-30 – 2016-06-01 (×3): 10 mg via ORAL
  Filled 2016-05-29 (×3): qty 1

## 2016-05-29 MED ORDER — LISINOPRIL 10 MG PO TABS
5.0000 mg | ORAL_TABLET | Freq: Once | ORAL | Status: AC
  Administered 2016-05-29: 5 mg via ORAL
  Filled 2016-05-29: qty 1

## 2016-05-29 NOTE — Progress Notes (Signed)
Disposition: Recommend psychiatric Inpatient admission when medically cleared. LCSWA [facility]  Minda MeoRowan Forsyth  Will inform once bed is available.  Vivi BarrackNicole Arnetta Odeh, Theresia MajorsLCSWA, MSW Clinical Social Worker 5E and Psychiatric Service Line [phone] 05/29/2016  1:22 PM

## 2016-05-29 NOTE — Progress Notes (Signed)
PROGRESS NOTE    Gene Meringommy Cavalieri  BJY:[id number] DOB: 02/27/1961 DOA: 05/26/2016 PCP: Ambrose FinlandValerie A [name], NP   Brief Narrative: Gene Garcia is a [age] y.o. male with medical history Hypertension, previous stroke and cocaine abuse.  He presented to the ED with 2 day history mid-sternal chest pain described as Sharp aching pain, 7-8/10,  Lasting several hours, aggravated by standing outside in the heat and sometimes by exertion and sometimes not.  No known alleviating factor.  Denies shortness of breath, nausea, vomiting, diaphoresis, sputum production, No fever or chills, but admits to some intermittent dry cough.  The patient also has history of depression and PTSD and has been recently suicidal.   Assessment & Plan:   Principal Problem:   Pain in the chest Active Problems:   Suicidal ideation   Cocaine use disorder, severe, dependence (HCC)   Hypokalemia   Thrombocytopenia (HCC)   Chest pain  1-Chest pain; troponin negative times 3.  ECHO normal.  Ativan PRN for chest pain, recent drug use.  Started  protonix  Chest pain free.   2-Depression, suicidal thought; continue with sitter. Psych consulted.  Need inpatient admission to psychiatric unit.   3-Cocaine use; SW consulted.  4-mild thrombocytopenia; follow trend.  5-Hypothyroidism ; Elevated TSH, low Free T 4, T 3;  Patient report that he has not been taking his medications. synthroid resume on admission. Continue with current dose.   DVT prophylaxis: Lovenox Code Status: Full Code.  Family Communication: care discussed with patient  Disposition Plan: stable to be transfer to psych unit.    Consultants:   Psych    Procedures:  ECHO normal.    Antimicrobials:   none   Subjective: Feeling well, denies chest pain.    Objective: Vitals:   05/28/16 0711 05/28/16 1421 05/28/16 2100 05/29/16 0558  BP: (!) 142/84 (!) 152/84 (!) 143/92 (!) 161/94  Pulse:  60 66 (!) 59  Resp:  18 20 18   Temp:  98.8 F (37.1 C)   98.2 F (36.8 C)  TempSrc:  Oral Oral Oral  SpO2:   100% 99%  Weight:      Height:        Intake/Output Summary (Last 24 hours) at 05/29/16 1421 Last data filed at 05/29/16 0830  Gross per 24 hour  Intake              720 ml  Output                0 ml  Net              720 ml   Filed Weights   05/26/16 1611  Weight: 77.1 kg (170 lb)    Examination:  General exam: Appears calm and comfortable  Respiratory system: Clear to auscultation. Respiratory effort normal. Cardiovascular system: S1 & S2 heard, RRR. No JVD, murmurs, rubs, gallops or clicks. No pedal edema. Gastrointestinal system: Abdomen is nondistended, soft and nontender. No organomegaly or masses felt. Normal bowel sounds heard. Central nervous system: Alert and oriented. No focal neurological deficits. Extremities: Symmetric 5 x 5 power. Skin: No rashes, lesions or ulcers Psychiatry: Judgement and insight appear normal. Mood & affect appropriate.     Data Reviewed: I have personally reviewed following labs and imaging studies  CBC:  Recent Labs Lab 05/26/16 1720  WBC 6.6  HGB 14.5  HCT 44.3  MCV 84.9  PLT 130*   Basic Metabolic Panel:  Recent Labs Lab 05/26/16 1720 05/28/16 0511  NA 140  140  K 3.4* 3.2*  CL 105 109  CO2 28 25  GLUCOSE 80 130*  BUN 16 16  CREATININE 1.09 0.99  CALCIUM 9.0 8.4*   GFR: Estimated Creatinine Clearance: 83.3 mL/min (by C-G formula based on SCr of 0.99 mg/dL). Liver Function Tests:  Recent Labs Lab 05/26/16 1720  AST 27  ALT 22  ALKPHOS 74  BILITOT 1.3*  PROT 7.6  ALBUMIN 4.3   No results for input(s): LIPASE, AMYLASE in the last 168 hours. No results for input(s): AMMONIA in the last 168 hours. Coagulation Profile: No results for input(s): INR, PROTIME in the last 168 hours. Cardiac Enzymes:  Recent Labs Lab 05/26/16 1720 05/27/16 0820 05/27/16 1415 05/27/16 2007  TROPONINI 0.03* <0.03 <0.03 <0.03   BNP (last 3 results) No results for  input(s): PROBNP in the last 8760 hours. HbA1C: No results for input(s): HGBA1C in the last 72 hours. CBG: No results for input(s): GLUCAP in the last 168 hours. Lipid Profile: No results for input(s): CHOL, HDL, LDLCALC, TRIG, CHOLHDL, LDLDIRECT in the last 72 hours. Thyroid Function Tests:  Recent Labs  05/26/16 2313 05/27/16 2007  TSH 7.055*  --   FREET4  --  0.54*  T3FREE  --  1.9*   Anemia Panel: No results for input(s): VITAMINB12, FOLATE, FERRITIN, TIBC, IRON, RETICCTPCT in the last 72 hours. Sepsis Labs: No results for input(s): PROCALCITON, LATICACIDVEN in the last 168 hours.  No results found for this or any previous visit (from the past 240 hour(s)).       Radiology Studies: No results found.      Scheduled Meds: . amLODipine  10 mg Oral Daily  . aspirin EC  325 mg Oral Daily  . enoxaparin (LOVENOX) injection  40 mg Subcutaneous Q24H  . levothyroxine  50 mcg Oral QAC breakfast  . [START ON 05/30/2016] lisinopril  10 mg Oral Daily  . multivitamin with minerals  1 tablet Oral Daily  . pantoprazole  40 mg Oral BID  . sertraline  50 mg Oral Daily   Continuous Infusions:    LOS: 0 days    Time spent: 35 minutes.     Alba Coryegalado, Belkys A, MD [facility] Pager [phone]  If 7PM-7AM, please contact night-coverage [email or web address] Password [email or web address] 05/29/2016, 2:21 PM

## 2016-05-30 DIAGNOSIS — F1420 Cocaine dependence, uncomplicated: Secondary | ICD-10-CM

## 2016-05-30 NOTE — Progress Notes (Addendum)
PROGRESS NOTE    Gene Garcia  ZOX:[id number] DOB: 07-16-1961 DOA: 05/26/2016 PCP: Ambrose FinlandValerie A [name], NP   Brief Narrative: Gene Garcia is a 55 y.o. male with medical history Hypertension, previous stroke and cocaine abuse.  He presented to the ED with 2 day history mid-sternal chest pain described as Sharp aching pain, 7-8/10,  Lasting several hours, aggravated by standing outside in the heat and sometimes by exertion and sometimes not.  No known alleviating factor.  Denies shortness of breath, nausea, vomiting, diaphoresis, sputum production, No fever or chills, but admits to some intermittent dry cough.  The patient also has history of depression and PTSD and has been recently suicidal.   Assessment & Plan:   Principal Problem:   Pain in the chest Active Problems:   Suicidal ideation   Cocaine use disorder, severe, dependence (HCC)   Hypokalemia   Thrombocytopenia (HCC)   Chest pain  [facility]   1-Chest pain; troponin negative times 3.  ECHO normal.  Ativan PRN for chest pain, recent drug use.  Started  protonix  Chest pain free.   2-Depression, suicidal thought; continue with sitter. Psych consulted.  Need inpatient admission to psychiatric unit.  Patient denies suicidal thought, but said my mind is crazy,.   3-Cocaine use; SW consulted.   4-mild thrombocytopenia; follow trend.   5-Hypothyroidism ; Elevated TSH, low Free T 4, T 3;  Patient report that he has not been taking his medications. synthroid resume on admission. Continue with current dose.   DVT prophylaxis: Lovenox Code Status: Full Code.  Family Communication: care discussed with patient  Disposition Plan: stable to be transfer to psych unit.    Consultants:   Psych    Procedures:  ECHO normal.    Antimicrobials:   none   Subjective: Feeling well, denies chest pain.  Last episode yesterday morning, like gas pain.    Objective: Vitals:   05/28/16 2100 05/29/16 0558 05/29/16  2004 05/30/16 0601  BP: (!) 143/92 (!) 161/94 (!) 134/94 (!) 149/84  Pulse: 66 (!) 59 (!) 57 (!) 51  Resp: 20 18 16 18   Temp:  98.2 F (36.8 C) 98.2 F (36.8 C) 97.8 F (36.6 C)  TempSrc: Oral Oral Oral Oral  SpO2: 100% 99% 100% 100%  Weight:      Height:        Intake/Output Summary (Last 24 hours) at 05/30/16 1219 Last data filed at 05/30/16 0900  Gross per 24 hour  Intake              720 ml  Output                0 ml  Net              720 ml   Filed Weights   05/26/16 1611  Weight: 77.1 kg (170 lb)    Examination:  General exam: Appears calm and comfortable  Respiratory system: Clear to auscultation. Respiratory effort normal. Cardiovascular system: S1 & S2 heard, RRR. No JVD, murmurs, rubs, gallops or clicks. No pedal edema. Gastrointestinal system: Abdomen is nondistended, soft and nontender. No organomegaly or masses felt. Normal bowel sounds heard. Central nervous system: Alert and oriented. No focal neurological deficits. Extremities: Symmetric 5 x 5 power. Skin: No rashes, lesions or ulcers Psychiatry: Judgement and insight appear normal. Mood & affect appropriate.     Data Reviewed: I have personally reviewed following labs and imaging studies  CBC:  Recent Labs Lab 05/26/16  1720  WBC 6.6  HGB 14.5  HCT 44.3  MCV 84.9  PLT 130*   Basic Metabolic Panel:  Recent Labs Lab 05/26/16 1720 05/28/16 0511 05/29/16 1525  NA 140 140 141  K 3.4* 3.2* 4.4  CL 105 109 106  CO2 28 25 31   GLUCOSE 80 130* 80  BUN 16 16 14   CREATININE 1.09 0.99 0.93  CALCIUM 9.0 8.4* 8.8*   GFR: Estimated Creatinine Clearance: 88.7 mL/min (by C-G formula based on SCr of 0.93 mg/dL). Liver Function Tests:  Recent Labs Lab 05/26/16 1720  AST 27  ALT 22  ALKPHOS 74  BILITOT 1.3*  PROT 7.6  ALBUMIN 4.3   No results for input(s): LIPASE, AMYLASE in the last 168 hours. No results for input(s): AMMONIA in the last 168 hours. Coagulation Profile: No results for  input(s): INR, PROTIME in the last 168 hours. Cardiac Enzymes:  Recent Labs Lab 05/26/16 1720 05/27/16 0820 05/27/16 1415 05/27/16 2007  TROPONINI 0.03* <0.03 <0.03 <0.03   BNP (last 3 results) No results for input(s): PROBNP in the last 8760 hours. HbA1C: No results for input(s): HGBA1C in the last 72 hours. CBG: No results for input(s): GLUCAP in the last 168 hours. Lipid Profile: No results for input(s): CHOL, HDL, LDLCALC, TRIG, CHOLHDL, LDLDIRECT in the last 72 hours. Thyroid Function Tests:  Recent Labs  05/27/16 2007  FREET4 0.54*  T3FREE 1.9*   Anemia Panel: No results for input(s): VITAMINB12, FOLATE, FERRITIN, TIBC, IRON, RETICCTPCT in the last 72 hours. Sepsis Labs: No results for input(s): PROCALCITON, LATICACIDVEN in the last 168 hours.  No results found for this or any previous visit (from the past 240 hour(s)).       Radiology Studies: No results found.      Scheduled Meds: . amLODipine  10 mg Oral Daily  . aspirin EC  325 mg Oral Daily  . enoxaparin (LOVENOX) injection  40 mg Subcutaneous Q24H  . levothyroxine  50 mcg Oral QAC breakfast  . lisinopril  10 mg Oral Daily  . multivitamin with minerals  1 tablet Oral Daily  . pantoprazole  40 mg Oral BID  . sertraline  50 mg Oral Daily   Continuous Infusions:    LOS: 0 days    Time spent: 35 minutes.     Alba Coryegalado, Belkys A, MD [facility] Pager [phone]  If 7PM-7AM, please contact night-coverage [email or web address] Password [email or web address] 05/30/2016, 12:19 PM

## 2016-05-31 NOTE — Plan of Care (Signed)
Problem: Education: Goal: Knowledge of [facility] Education information/materials will improve Outcome: Completed/Met Date Met: 05/31/16 Education provided regarding updated poc.

## 2016-05-31 NOTE — Plan of Care (Signed)
Problem: Skin Integrity: Goal: Risk for impaired skin integrity will decrease Outcome: Completed/Met Date Met: 05/31/16 Pt is ambulatory without assist. Skin is intact. Not at risk

## 2016-05-31 NOTE — Plan of Care (Signed)
Problem: Activity: Goal: Risk for activity intolerance will decrease Outcome: Completed/Met Date Met: 05/31/16 Pt is out of bed without assist. Gait has remained steady. Activity has been tolerated well.

## 2016-05-31 NOTE — Plan of Care (Signed)
Problem: Safety: Goal: Ability to remain free from injury will improve Outcome: Completed/Met Date Met: 05/31/16 Pt is ambulatory without assist. Gait is steady. Sitter remains at bedside.

## 2016-05-31 NOTE — Progress Notes (Signed)
PROGRESS NOTE    Gene Garcia  ZOX:[id number] DOB: 1960/11/20 DOA: 05/26/2016 PCP: Ambrose FinlandValerie A [name], NP   Brief Narrative: Gene Garcia is a 55 y.o. male with medical history Hypertension, previous stroke and cocaine abuse.  He presented to the ED with 2 day history mid-sternal chest pain described as Sharp aching pain, 7-8/10,  Lasting several hours, aggravated by standing outside in the heat and sometimes by exertion and sometimes not.  No known alleviating factor.  Denies shortness of breath, nausea, vomiting, diaphoresis, sputum production, No fever or chills, but admits to some intermittent dry cough.  The patient also has history of depression and PTSD and has been recently suicidal.   Assessment & Plan:   Principal Problem:   Pain in the chest Active Problems:   Suicidal ideation   Cocaine use disorder, severe, dependence (HCC)   Hypokalemia   Thrombocytopenia (HCC)   Chest pain  [facility]   1-Chest pain; troponin negative times 3.  ECHO normal.  Ativan PRN for chest pain, recent drug use.  Started  protonix  Chest pain free.   2-Depression, suicidal thought; continue with sitter. Psych consulted.  Need inpatient admission to psychiatric unit.  Patient will benefit from inpatient psych admission.   3-Cocaine use; SW consulted.   4-mild thrombocytopenia; follow trend.   5-Hypothyroidism ; Elevated TSH, low Free T 4, T 3;  Patient report that he has not been taking his medications. synthroid resume on admission. Continue with current dose.   DVT prophylaxis: Lovenox Code Status: Full Code.  Family Communication: care discussed with patient  Disposition Plan: stable to be transfer to psych unit.    Consultants:   Psych    Procedures:  ECHO normal.    Antimicrobials:   none   Subjective:  no further chest pain.  Still feels depress.    Objective: Vitals:   05/30/16 1400 05/30/16 2155 05/31/16 0614 05/31/16 1048  BP: (!) 148/87 127/79  132/73 125/79  Pulse: (!) 56 62 (!) 56   Resp: 16 18 18    Temp: 97.6 F (36.4 C) 97.8 F (36.6 C) 98.3 F (36.8 C)   TempSrc: Oral Oral Oral   SpO2: 100% 99% 100%   Weight:      Height:        Intake/Output Summary (Last 24 hours) at 05/31/16 1308 Last data filed at 05/31/16 1259  Gross per 24 hour  Intake             1200 ml  Output                0 ml  Net             1200 ml   Filed Weights   05/26/16 1611  Weight: 77.1 kg (170 lb)    Examination:  General exam: Appears calm and comfortable  Respiratory system: Clear to auscultation. Respiratory effort normal. Cardiovascular system: S1 & S2 heard, RRR. No JVD, murmurs, rubs, gallops or clicks. No pedal edema. Gastrointestinal system: Abdomen is nondistended, soft and nontender. No organomegaly or masses felt. Normal bowel sounds heard. Central nervous system: Alert and oriented. No focal neurological deficits. Extremities: Symmetric 5 x 5 power. Skin: No rashes, lesions or ulcers Psychiatry: Judgement and insight appear normal. Mood & affect appropriate.     Data Reviewed: I have personally reviewed following labs and imaging studies  CBC:  Recent Labs Lab 05/26/16 1720  WBC 6.6  HGB 14.5  HCT 44.3  MCV 84.9  PLT 130*   Basic Metabolic Panel:  Recent Labs Lab 05/26/16 1720 05/28/16 0511 05/29/16 1525  NA 140 140 141  K 3.4* 3.2* 4.4  CL 105 109 106  CO2 28 25 31   GLUCOSE 80 130* 80  BUN 16 16 14   CREATININE 1.09 0.99 0.93  CALCIUM 9.0 8.4* 8.8*   GFR: Estimated Creatinine Clearance: 88.7 mL/min (by C-G formula based on SCr of 0.93 mg/dL). Liver Function Tests:  Recent Labs Lab 05/26/16 1720  AST 27  ALT 22  ALKPHOS 74  BILITOT 1.3*  PROT 7.6  ALBUMIN 4.3   No results for input(s): LIPASE, AMYLASE in the last 168 hours. No results for input(s): AMMONIA in the last 168 hours. Coagulation Profile: No results for input(s): INR, PROTIME in the last 168 hours. Cardiac  Enzymes:  Recent Labs Lab 05/26/16 1720 05/27/16 0820 05/27/16 1415 05/27/16 2007  TROPONINI 0.03* <0.03 <0.03 <0.03   BNP (last 3 results) No results for input(s): PROBNP in the last 8760 hours. HbA1C: No results for input(s): HGBA1C in the last 72 hours. CBG: No results for input(s): GLUCAP in the last 168 hours. Lipid Profile: No results for input(s): CHOL, HDL, LDLCALC, TRIG, CHOLHDL, LDLDIRECT in the last 72 hours. Thyroid Function Tests: No results for input(s): TSH, T4TOTAL, FREET4, T3FREE, THYROIDAB in the last 72 hours. Anemia Panel: No results for input(s): VITAMINB12, FOLATE, FERRITIN, TIBC, IRON, RETICCTPCT in the last 72 hours. Sepsis Labs: No results for input(s): PROCALCITON, LATICACIDVEN in the last 168 hours.  No results found for this or any previous visit (from the past 240 hour(s)).       Radiology Studies: No results found.      Scheduled Meds: . amLODipine  10 mg Oral Daily  . aspirin EC  325 mg Oral Daily  . enoxaparin (LOVENOX) injection  40 mg Subcutaneous Q24H  . levothyroxine  50 mcg Oral QAC breakfast  . lisinopril  10 mg Oral Daily  . multivitamin with minerals  1 tablet Oral Daily  . pantoprazole  40 mg Oral BID  . sertraline  50 mg Oral Daily   Continuous Infusions:    LOS: 0 days    Time spent: 35 minutes.     Alba Coryegalado, Belkys A, MD [facility] Pager [phone]  If 7PM-7AM, please contact night-coverage [email or web address] Password [email or web address] 05/31/2016, 1:08 PM

## 2016-05-31 NOTE — Progress Notes (Signed)
Patient is resting currently. Sitter at bedside. Room assessed

## 2016-06-01 DIAGNOSIS — F1414 Cocaine abuse with cocaine-induced mood disorder: Secondary | ICD-10-CM

## 2016-06-01 LAB — URINE DRUGS OF ABUSE SCREEN W ALC, ROUTINE (REF LAB)
AMPHETAMINES, URINE: NEGATIVE ng/mL
Barbiturate, Ur: NEGATIVE ng/mL
Benzodiazepine Quant, Ur: NEGATIVE ng/mL
Cannabinoid Quant, Ur: NEGATIVE ng/mL
Ethanol U, Quan: NEGATIVE %
METHADONE SCREEN, URINE: NEGATIVE ng/mL
OPIATE QUANT UR: NEGATIVE ng/mL
PHENCYCLIDINE, UR: NEGATIVE ng/mL
Propoxyphene, Urine: NEGATIVE ng/mL

## 2016-06-01 LAB — COCAINE CONF, UR: COCAINE METAB QUANT UR: POSITIVE — AB

## 2016-06-01 MED ORDER — PANTOPRAZOLE SODIUM 40 MG PO TBEC: 40.0000 mg | DELAYED_RELEASE_TABLET | Freq: Two times a day (BID) | ORAL | 0 refills | Status: AC

## 2016-06-01 MED ORDER — LISINOPRIL 10 MG PO TABS: 10.0000 mg | ORAL_TABLET | Freq: Every day | ORAL | 0 refills | Status: AC

## 2016-06-01 MED ORDER — TRAZODONE HCL 150 MG PO TABS: 150.0000 mg | ORAL_TABLET | Freq: Every evening | ORAL | 0 refills | Status: DC | PRN

## 2016-06-01 MED ORDER — ADULT MULTIVITAMIN W/MINERALS CH: 1.0000 | ORAL_TABLET | Freq: Every day | ORAL | 0 refills | Status: AC

## 2016-06-01 MED ORDER — AMLODIPINE BESYLATE 10 MG PO TABS: 10.0000 mg | ORAL_TABLET | Freq: Every day | ORAL | 0 refills | Status: AC

## 2016-06-01 MED ORDER — SERTRALINE HCL 50 MG PO TABS: 50.0000 mg | ORAL_TABLET | Freq: Every day | ORAL | 0 refills | Status: DC

## 2016-06-01 MED ORDER — LEVOTHYROXINE SODIUM 50 MCG PO TABS: 50.0000 ug | ORAL_TABLET | Freq: Every day | ORAL | 0 refills | Status: AC

## 2016-06-01 NOTE — Progress Notes (Signed)
LCSWA,Psychiatrist MD  met with patient at bedside. Patient reported he was feeling better and was hopeful that things would get better for him. Patient expressed interest in group therapy, "I want to talk to people going through what I am going through." LCSWA provided the patient with resource information. Patient was receptive to resources for [facility], [facility] for group therapy, outpatient resources for SA, shelter list and two bus passes Patient to follow with [facility] for medication.  Patient expressed no other concerns.

## 2016-06-01 NOTE — Discharge Summary (Signed)
Physician Discharge Summary  Gene Garcia ZOX:[id number] DOB: 1961-08-27 DOA: 05/26/2016  PCP: Ambrose FinlandValerie A [name], NP  Admit date: 05/26/2016 Discharge date: 06/01/2016  Admitted From: Home Disposition:  Home   Recommendations for Outpatient Follow-up:  1. Follow up with PCP in 1-2 weeks 2. Please obtain BMP/CBC in one week 3. needs repeat TSH     Discharge Condition: stable.  CODE STATUS: full code.  Diet recommendation: Heart Healthy   Brief/Interim Summary: Charlies Milleris a 55 y.o.malewith medical history Hypertension, previous stroke and cocaine abuse. He presented to the ED with 2 day history mid-sternal chest pain described asSharp aching pain, 7-8/10, Lasting several hours, aggravated by standing outside in the heat and sometimes by exertion and sometimes not. No known alleviating factor. Denies shortness of breath, nausea, vomiting, diaphoresis, sputum production, No fever or chills, but admits to some intermittent dry cough.  The patient also has history of depression and PTSD and has been recently suicidal.   Assessment & Plan:   Principal Problem:   Pain in the chest Active Problems:   Suicidal ideation   Cocaine use disorder, severe, dependence (HCC)   Hypokalemia   Thrombocytopenia (HCC)   Chest pain    1-Chest pain; troponin negative times 3.  ECHO normal.  Ativan PRN for chest pain, recent drug use.  Started  protonix. Pain resolved on protonix.  Chest pain free.   2-Depression, suicidal thought; continue with sitter. Psych consulted.  Need inpatient admission to psychiatric unit.  evaluated by Psych today. He denies suicidal thought. He was clear by psych.   3-Cocaine use; SW consulted.   4-mild thrombocytopenia; follow trend.   5-Hypothyroidism ; Elevated TSH, low Free T 4, T 3;  Patient report that he has not been taking his medications. synthroid resume on admission. Continue with current dose.    Discharge Diagnoses:  Principal  Problem:   Pain in the chest Active Problems:   Suicidal ideation   Cocaine use disorder, severe, dependence (HCC)   Hypokalemia   Thrombocytopenia (HCC)   Chest pain    Discharge Instructions  Discharge Instructions    Diet - low sodium heart healthy    Complete by:  As directed   Increase activity slowly    Complete by:  As directed       Medication List    TAKE these medications   amLODipine 10 MG tablet Commonly known as:  NORVASC Take 1 tablet (10 mg total) by mouth daily. For high blood pressure   aspirin EC 81 MG tablet Take 1 tablet (81 mg total) by mouth daily. For heart health   hydrOXYzine 25 MG tablet Commonly known as:  ATARAX/VISTARIL Take 1 tablet (25 mg total) by mouth every 6 (six) hours as needed for anxiety.   levothyroxine 50 MCG tablet Commonly known as:  SYNTHROID, LEVOTHROID Take 1 tablet (50 mcg total) by mouth daily before breakfast. For Thyroid   lisinopril 10 MG tablet Commonly known as:  PRINIVIL,ZESTRIL Take 1 tablet (10 mg total) by mouth daily. What changed:  medication strength  how much to take  additional instructions   multivitamin with minerals Tabs tablet Take 1 tablet by mouth daily.   pantoprazole 40 MG tablet Commonly known as:  PROTONIX Take 1 tablet (40 mg total) by mouth 2 (two) times daily.   sertraline 50 MG tablet Commonly known as:  ZOLOFT Take 1 tablet (50 mg total) by mouth daily. For depression   traZODone 150 MG tablet Commonly known as:  DESYREL  Take 1 tablet (150 mg total) by mouth at bedtime as needed for sleep.       No Known Allergies  Consultations: Psych.   Procedures/Studies: Dg Chest 2 View  Result Date: 05/26/2016 CLINICAL DATA:  Shortness of breath with central chest pain and cough. EXAM: CHEST  2 VIEW COMPARISON:  12/11/2015 FINDINGS: The heart size and mediastinal contours are within normal limits. Both lungs are clear. The visualized skeletal structures are unremarkable.  IMPRESSION: No active cardiopulmonary disease. Electronically Signed   By: Kennith CenterEric  Mansell M.D.   On: 05/26/2016 17:03  ECHO normal     Subjective: Chest pain free. Feeling well.   Discharge Exam: Vitals:   05/31/16 2118 06/01/16 0500  BP: 132/77 129/78  Pulse: (!) 56 (!) 54  Resp: 18 18  Temp: 97.7 F (36.5 C) 97.5 F (36.4 C)   Vitals:   05/31/16 1048 05/31/16 1400 05/31/16 2118 06/01/16 0500  BP: 125/79 123/79 132/77 129/78  Pulse:  (!) 58 (!) 56 (!) 54  Resp:  16 18 18   Temp:  98.2 F (36.8 C) 97.7 F (36.5 C) 97.5 F (36.4 C)  TempSrc:  Oral Oral Oral  SpO2:  100% 100% 100%  Weight:      Height:        General: Pt is alert, awake, not in acute distress Cardiovascular: RRR, S1/S2 +, no rubs, no gallops Respiratory: CTA bilaterally, no wheezing, no rhonchi Abdominal: Soft, NT, ND, bowel sounds + Extremities: no edema, no cyanosis    The results of significant diagnostics from this hospitalization (including imaging, microbiology, ancillary and laboratory) are listed below for reference.     Microbiology: No results found for this or any previous visit (from the past 240 hour(s)).   Labs: BNP (last 3 results) No results for input(s): BNP in the last 8760 hours. Basic Metabolic Panel:  Recent Labs Lab 05/26/16 1720 05/28/16 0511 05/29/16 1525  NA 140 140 141  K 3.4* 3.2* 4.4  CL 105 109 106  CO2 28 25 31   GLUCOSE 80 130* 80  BUN 16 16 14   CREATININE 1.09 0.99 0.93  CALCIUM 9.0 8.4* 8.8*   Liver Function Tests:  Recent Labs Lab 05/26/16 1720  AST 27  ALT 22  ALKPHOS 74  BILITOT 1.3*  PROT 7.6  ALBUMIN 4.3   No results for input(s): LIPASE, AMYLASE in the last 168 hours. No results for input(s): AMMONIA in the last 168 hours. CBC:  Recent Labs Lab 05/26/16 1720  WBC 6.6  HGB 14.5  HCT 44.3  MCV 84.9  PLT 130*   Cardiac Enzymes:  Recent Labs Lab 05/26/16 1720 05/27/16 0820 05/27/16 1415 05/27/16 2007  TROPONINI 0.03* <0.03  <0.03 <0.03   BNP: Invalid input(s): POCBNP CBG: No results for input(s): GLUCAP in the last 168 hours. D-Dimer No results for input(s): DDIMER in the last 72 hours. Hgb A1c No results for input(s): HGBA1C in the last 72 hours. Lipid Profile No results for input(s): CHOL, HDL, LDLCALC, TRIG, CHOLHDL, LDLDIRECT in the last 72 hours. Thyroid function studies No results for input(s): TSH, T4TOTAL, T3FREE, THYROIDAB in the last 72 hours.  Invalid input(s): FREET3 Anemia work up No results for input(s): VITAMINB12, FOLATE, FERRITIN, TIBC, IRON, RETICCTPCT in the last 72 hours. Urinalysis    Component Value Date/Time   COLORURINE YELLOW 10/16/2014 2124   APPEARANCEUR CLOUDY (A) 10/16/2014 2124   LABSPEC 1.021 10/16/2014 2124   PHURINE 7.0 10/16/2014 2124   GLUCOSEU NEGATIVE 10/16/2014 2124  HGBUR NEGATIVE 10/16/2014 2124   BILIRUBINUR NEGATIVE 10/16/2014 2124   KETONESUR NEGATIVE 10/16/2014 2124   PROTEINUR NEGATIVE 10/16/2014 2124   UROBILINOGEN 1.0 10/16/2014 2124   NITRITE NEGATIVE 10/16/2014 2124   LEUKOCYTESUR NEGATIVE 10/16/2014 2124   Sepsis Labs Invalid input(s): PROCALCITONIN,  WBC,  LACTICIDVEN Microbiology No results found for this or any previous visit (from the past 240 hour(s)).   Time coordinating discharge: Over 30 minutes  SIGNED:   Alba Cory, MD  [facility] 06/01/2016, 12:49 PM  If 7PM-7AM, please contact night-coverage [email or web address] Password [email or web address]

## 2016-06-01 NOTE — Care Management Note (Signed)
Case Management Note  Patient Details  Name: Gene Garcia MRN: [id number] Date of Birth: 01/29/1961  Subjective/Objective:  Patient has been cleard by psych-d/c home-patient is homeless-no insurance-[facility] will go to [facility]-will give meds, & medical eval) informed patient will be coming-must get there by 2p-CSW has provided secy w/2 bus passes, & [facility]-see note-Nsg aware.                  Action/Plan:d/c homeless shelter.   Expected Discharge Date:                  Expected Discharge Plan:  Homeless Shelter  In-House Referral:     Discharge planning Services  CM Consult, Medication Assistance  Post Acute Care Choice:    Choice offered to:     DME Arranged:    DME Agency:     HH Arranged:    HH Agency:     Status of Service:  Completed, signed off  If discussed at [employer] Meetings, dates discussed:    Additional Comments:  Lanier ClamMahabir, Tracie Lindbloom, RN 06/01/2016, 12:51 PM

## 2016-06-01 NOTE — Consult Note (Addendum)
[facility] Face-to-Face Psychiatry Consult   Reason for Consult:  Depression, cocaine intoxication and suicidal ideation Referring Physician:  Dr. Sunnie Nielsen Patient Identification: Gene Garcia MRN:  [id number] Principal Diagnosis: Pain in the chest Diagnosis:   Patient Active Problem List   Diagnosis Date Noted  . Pain in the chest [R07.9] 05/26/2016  . Thrombocytopenia (HCC) [D69.6] 05/26/2016  . Chest pain [R07.9] 05/26/2016  . Cocaine abuse [F14.10]   . Cocaine use disorder, severe, dependence (HCC) [F14.20] 10/06/2015  . 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 Substance or medication-induced anxiety disorder (HCC) [F19.980] 10/06/2015  . Hypokalemia [E87.6] 10/06/2015  . Suicidal ideation [R45.851]   . Unintended weight loss [R63.4] 04/09/2015  . HTN (hypertension) [I10] 11/21/2014  . History of stroke [Z86.73] 11/21/2014  . History of thyroid disease [Z86.39] 11/21/2014    Total Time spent with patient: 1 hour  Subjective:   Gene Garcia is a 55 y.o. male patient admitted with chest pain, cocaine intoxication, depression and suicidal ideation  HPI:  [name] is a 55 years old male admitted to [facility] with recent onset of chest pain, cocaine intoxication, depression and suicidal ideation. Patient has been suffering from hypertension and hypothyroidism but does not receive any medication management secondary to psychosocial stresses and access of medical care in mental health care. Patient reportedly suffering with depression and substance abuse over 3 years. Patient reported he has no family members, no friends, no psychosocial support: No job, no education and has been living as a homeless in [place] over 3 years. Patient reportedly came from [place] to change the environment but nothing helped. Patient reported he has a suicidal thoughts because he cannot go and get any help from  anybody, he stopped seeing his psychiatric services at [facility] and also stopped taking his medication. Patient reported he has a suicidal plans of jumping in front of the traffic or intentional overdose of medication but is scared to excrete his suicidal plan. Patient endorses working here and there temporarily and using that dollars for drinking alcohol or abusing cocaine. Patient urine drug screen is positive for cocaine and blood alcohol level is not significant. Patient cannot contract for safety and risk criteria for inpatient hospitalization.  Past Psychiatric History: Significant for substance induced mood disorder and multiple psychosocial stresses Patient has previous acute psychiatric hospitalization at behavioral [facility] December 2016 for similar clinical presentation.  Interval history: Patient has been doing much better with his current medication regimen and has no active suicide or homicide ideation, intention or plans. He is asking for group therapy at [facility], also to [facility] peer support group and [facility] where he can get assistance for place and job training. He slept fine and eating good. He has been in good mood and no reported behavioral problems.   Risk to Self: Is patient at risk for suicide?: no Risk to Others:   Prior Inpatient Therapy:   Prior Outpatient Therapy:    Past Medical History:  Past Medical History:  Diagnosis Date  . Asthma   . Hypertension   . Hypothyroidism   . Thyroid disease    History reviewed. No pertinent surgical history. Family History:  Family History  Problem Relation Age of Onset  . Diabetes Maternal Aunt    Family Psychiatric  History: Patient has no family history of mental illness. Social History:  History  Alcohol Use No     History  Drug Use  . Types: "  Crack" cocaine    Comment: pt states no drugs in 1 year    Social History   Social History  . Marital status: Single    Spouse name: N/A  . Number of children: N/A  . Years of  education: N/A   Social History Main Topics  . Smoking status: Never Smoker  . Smokeless tobacco: Never Used  . Alcohol use No  . Drug use:     Types: "Crack" cocaine     Comment: pt states no drugs in 1 year  . Sexual activity: Not Asked   Other Topics Concern  . None   Social History Narrative  . None   Additional Social History:    Allergies:  No Known Allergies  Labs:  No results found for this or any previous visit (from the past 48 hour(s)).  Current Facility-Administered Medications  Medication Dose Route Frequency Provider Last Rate Last Dose  . acetaminophen (TYLENOL) tablet 650 mg  650 mg Oral Q4H PRN Jackie PlumGeorge Osei-Bonsu, MD      . amLODipine (NORVASC) tablet 10 mg  10 mg Oral Daily Jackie PlumGeorge Osei-Bonsu, MD   10 mg at 06/01/16 0919  . aspirin EC tablet 325 mg  325 mg Oral Daily Jackie PlumGeorge Osei-Bonsu, MD   325 mg at 06/01/16 0919  . enoxaparin (LOVENOX) injection 40 mg  40 mg Subcutaneous Q24H Jackie PlumGeorge Osei-Bonsu, MD   40 mg at 06/01/16 0005  . gi cocktail (Maalox,Lidocaine,Donnatal)  30 mL Oral QID PRN Jackie PlumGeorge Osei-Bonsu, MD      . hydrOXYzine (ATARAX/VISTARIL) tablet 25 mg  25 mg Oral Q6H PRN Jackie PlumGeorge Osei-Bonsu, MD      . levothyroxine (SYNTHROID, LEVOTHROID) tablet 50 mcg  50 mcg Oral QAC breakfast Jackie PlumGeorge Osei-Bonsu, MD   50 mcg at 06/01/16 0756  . lisinopril (PRINIVIL,ZESTRIL) tablet 10 mg  10 mg Oral Daily Belkys A Regalado, MD   10 mg at 06/01/16 0919  . LORazepam (ATIVAN) injection 1 mg  1 mg Intravenous Q6H PRN Belkys A Regalado, MD      . morphine 2 MG/ML injection 2 mg  2 mg Intravenous Q2H PRN Jackie PlumGeorge Osei-Bonsu, MD      . multivitamin with minerals tablet 1 tablet  1 tablet Oral Daily Belkys A Regalado, MD   1 tablet at 06/01/16 0919  . ondansetron (ZOFRAN) injection 4 mg  4 mg Intravenous Q6H PRN Jackie PlumGeorge Osei-Bonsu, MD      . pantoprazole (PROTONIX) EC tablet 40 mg  40 mg Oral BID Belkys A Regalado, MD   40 mg at 06/01/16 0919  . sertraline (ZOLOFT) tablet 50 mg  50 mg  Oral Daily Jackie PlumGeorge Osei-Bonsu, MD   50 mg at 06/01/16 0919  . traZODone (DESYREL) tablet 150 mg  150 mg Oral QHS PRN Jackie PlumGeorge Osei-Bonsu, MD        Musculoskeletal: Strength & Muscle Tone: within normal limits Gait & Station: normal Patient leans: N/A  Psychiatric Specialty Exam: Physical Exam as per history and physical   ROS generalized weakness, depression, insomnia, chest pain No Fever-chills, No Headache, No changes with Vision or hearing, reports vertigo No problems swallowing food or Liquids, No Chest pain, Cough or Shortness of Breath, No Abdominal pain, No Nausea or Vommitting, Bowel movements are regular, No Blood in stool or Urine, No dysuria, No new skin rashes or bruises, No new joints pains-aches,  No new weakness, tingling, numbness in any extremity, No recent weight gain or loss, No polyuria, polydypsia or polyphagia,   A full 10  point Review of Systems was done, except as stated above, all other Review of Systems were negative.  Blood pressure 129/78, pulse (!) 54, temperature 97.5 F (36.4 C), temperature source Oral, resp. rate 18, height 5\' 9"  (1.753 m), weight 77.1 kg (170 lb), SpO2 100 %.Body mass index is 25.1 kg/m.  General Appearance: Negative and Guarded  Eye Contact:  Good  Speech:  Clear and Coherent  Volume:  Decreased  Mood:  Depressed, Hopeless and Worthless  Affect:  Constricted and Depressed  Thought Process:  Coherent and Goal Directed  Orientation:  Full (Time, Place, and Person)  Thought Content:  WDL  Suicidal Thoughts:  No   Homicidal Thoughts:  No  Memory:  Immediate;   Good Recent;   Fair Remote;   Fair  Judgement:  Impaired  Insight:  Fair  Psychomotor Activity:  Decreased  Concentration:  Concentration: Fair and Attention Span: Fair  Recall:  Good  Fund of Knowledge:  Good  Language:  Good  Akathisia:  Negative  Handed:  Right  AIMS (if indicated):     Assets:  Communication Skills Desire for Improvement Leisure  Time Resilience  ADL's:  Intact  Cognition:  WNL  Sleep:        Treatment Plan Summary: Patient presented with chest pain and cocaine intoxication and reportedly been depressed and has suicidal ideation without specific intent or plan. Patient has previous history of acute psychiatric hospitalization December 2016 for similar clinical presentation. Patient meet criteria for acute psychiatric hospitalization as he has limited psychosocial support and poor coping skills and has no previous substance abuse treatment.   Chest Pain: resolved.  Safety concern: Patient has contract for safety and denied current suicidal ideation for the last three days Substance-induced mood disorder: Continue Zoloft 50 mg daily for depression  Continue Trazodone 150 mg at bedtime for insomnia Continue Lisinopril for hypertension and synthroid for hypothyroidism Multiple psychosocial stressors: Referred to the unit social service and case management Appreciate psychiatric consultation and we sign off as of today Please contact [phone] or [phone] if needs further assistance   Disposition: Patient does not meet criteria for psychiatric inpatient admission. Supportive therapy provided about ongoing stressors.  Leata MouseJANARDHANA Raidyn Breiner, MD 06/01/2016 12:00 PM is

## 2016-12-10 IMAGING — CR DG CHEST 2V
2 series · 2 of 2 positions shown · non-contrast
Comparison: 10/16/2014

CLINICAL DATA: Midline chest pain yesterday, hypertension, asthma,
thyroid disease

EXAM:
CHEST  2 VIEW

[chest pa]
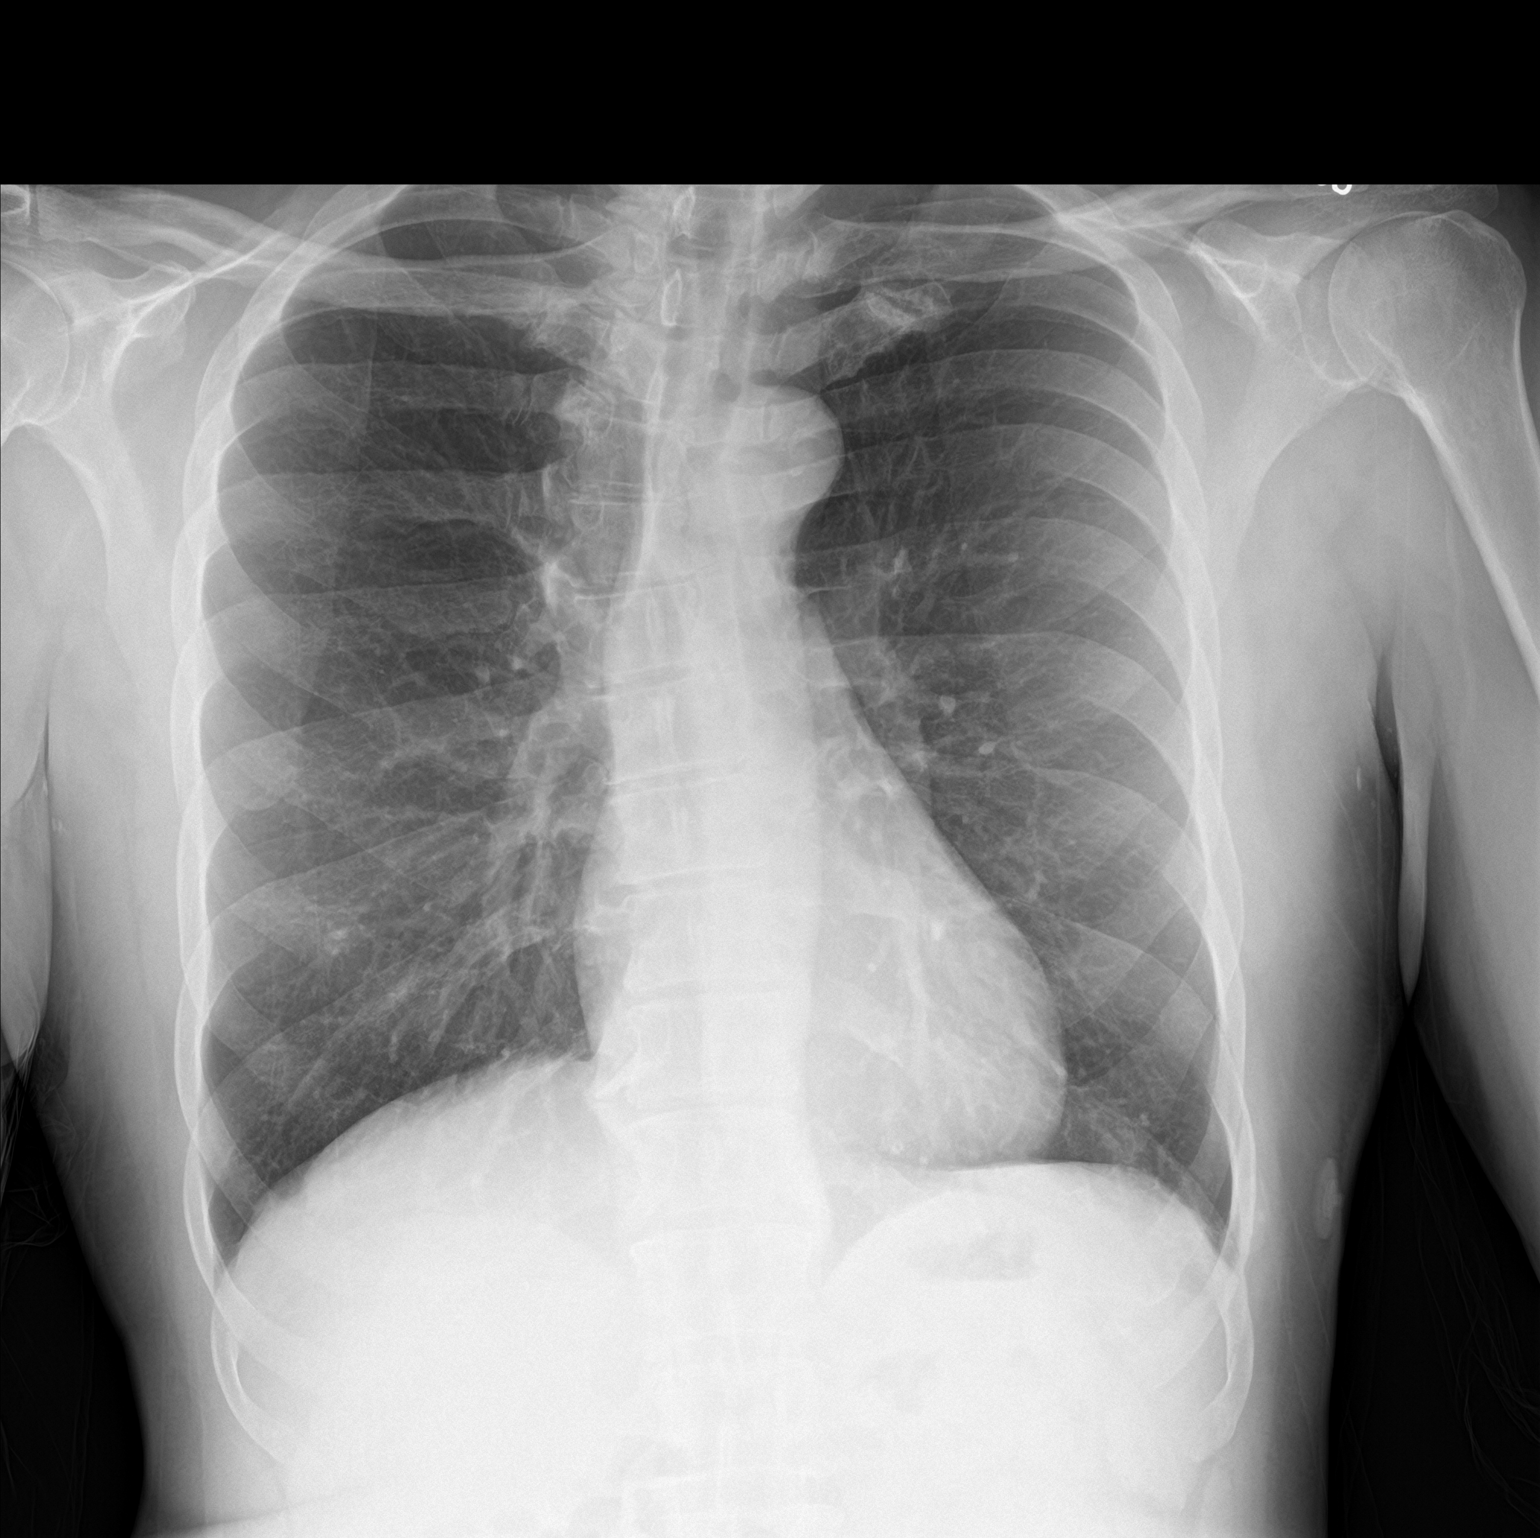

[chest lat]
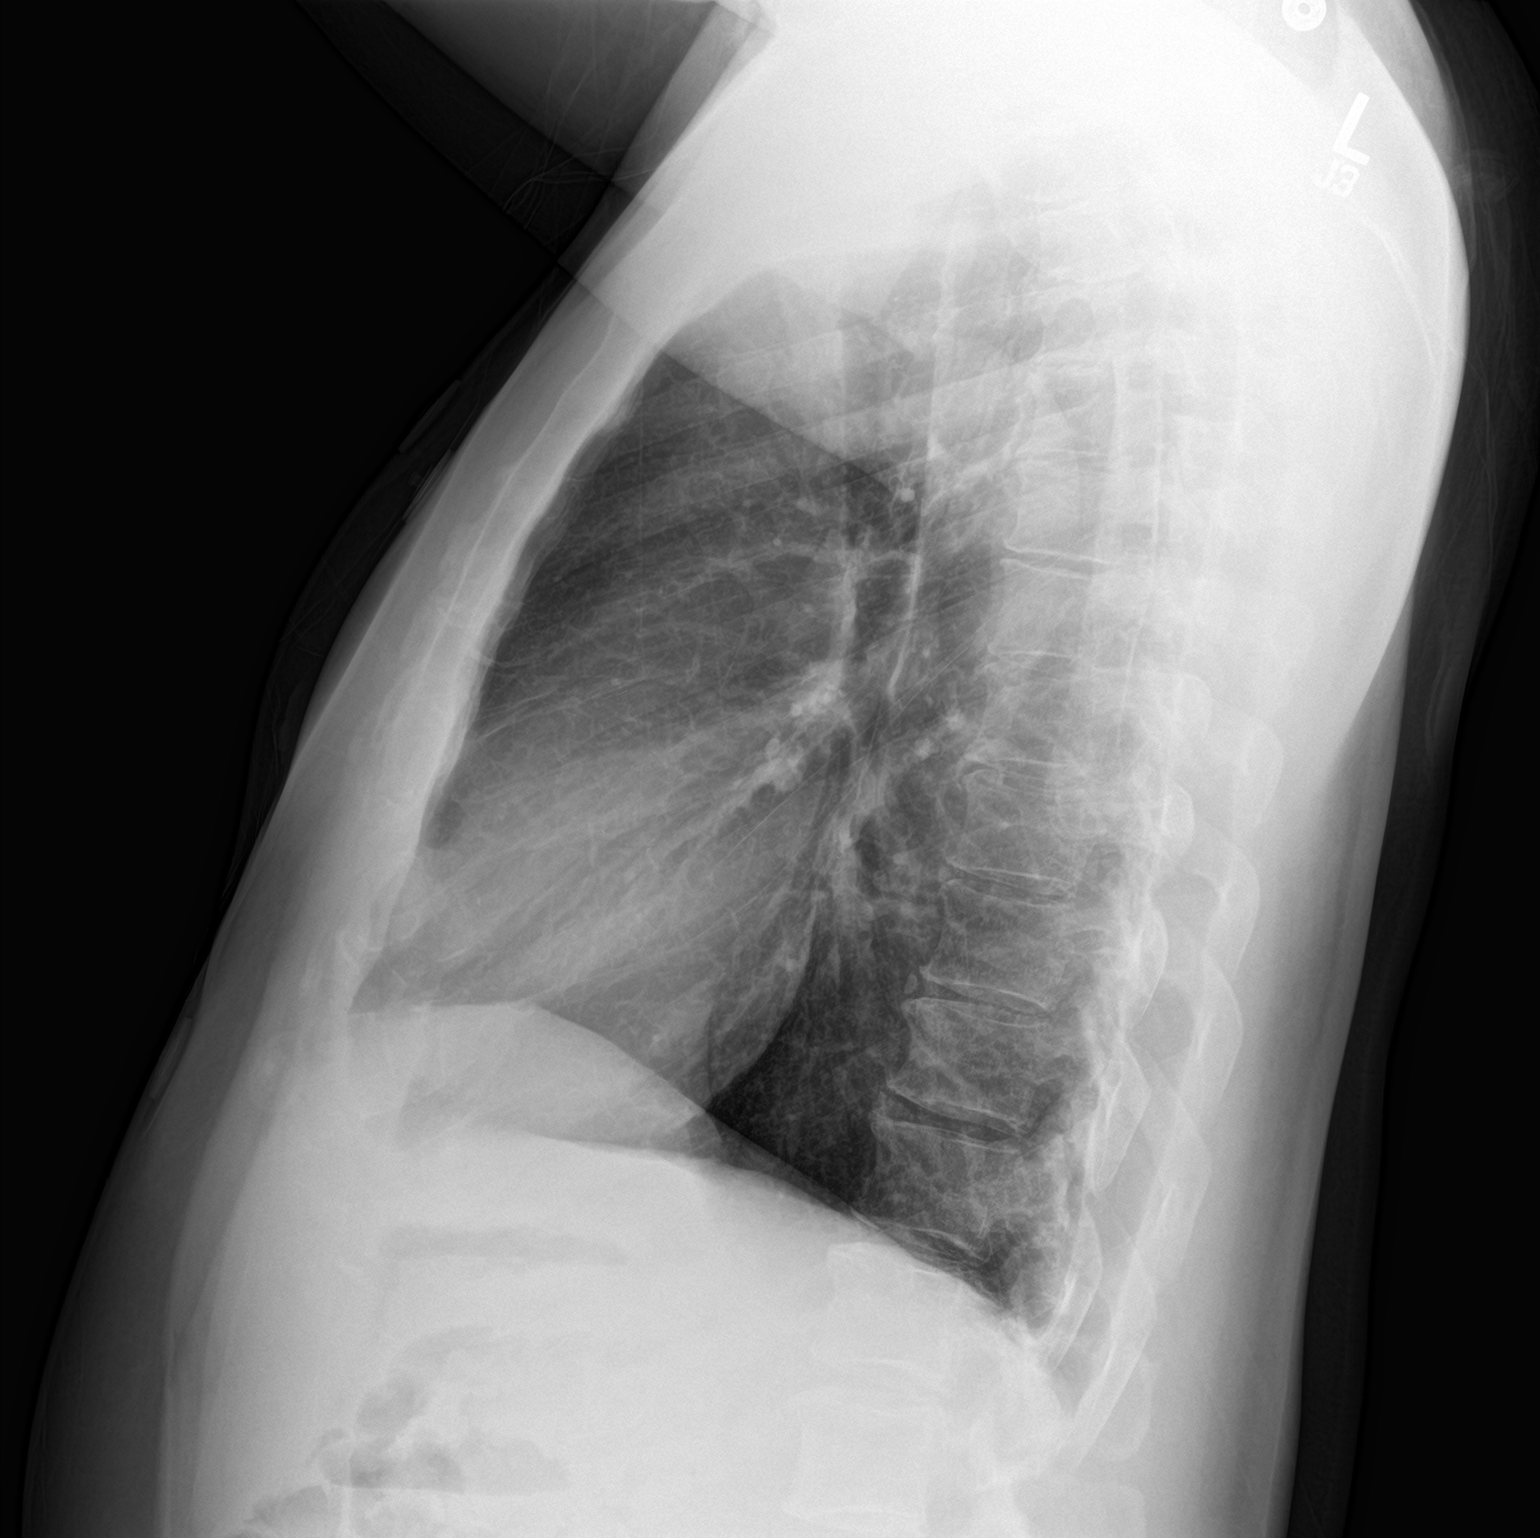

[2 of 2 positions shown; findings below may reference images not displayed]

FINDINGS: Normal heart size, mediastinal contours, and pulmonary vascularity.

Lungs clear.

No pleural effusion or pneumothorax.

Dextro convex upper thoracic scoliosis.

Bones demineralized.
IMPRESSION: No acute abnormalities.

## 2017-01-13 ENCOUNTER — Emergency Department (HOSPITAL_COMMUNITY)
Admission: EM | Admit: 2017-01-13 | Discharge: 2017-01-14 | Disposition: A | Discharge status: Home / Self Care | Payer: Self-pay

## 2017-01-13 ENCOUNTER — Encounter (HOSPITAL_COMMUNITY): Payer: Self-pay

## 2017-01-13 DIAGNOSIS — J45909 Unspecified asthma, uncomplicated: Secondary | ICD-10-CM | POA: Insufficient documentation

## 2017-01-13 DIAGNOSIS — F331 Major depressive disorder, recurrent, moderate: Secondary | ICD-10-CM | POA: Insufficient documentation

## 2017-01-13 DIAGNOSIS — Z7982 Long term (current) use of aspirin: Secondary | ICD-10-CM | POA: Insufficient documentation

## 2017-01-13 DIAGNOSIS — Z79899 Other long term (current) drug therapy: Secondary | ICD-10-CM | POA: Insufficient documentation

## 2017-01-13 DIAGNOSIS — Z8673 Personal history of transient ischemic attack (TIA), and cerebral infarction without residual deficits: Secondary | ICD-10-CM | POA: Insufficient documentation

## 2017-01-13 DIAGNOSIS — I10 Essential (primary) hypertension: Secondary | ICD-10-CM | POA: Insufficient documentation

## 2017-01-13 DIAGNOSIS — F1414 Cocaine abuse with cocaine-induced mood disorder: Secondary | ICD-10-CM | POA: Diagnosis present

## 2017-01-13 DIAGNOSIS — E039 Hypothyroidism, unspecified: Secondary | ICD-10-CM | POA: Insufficient documentation

## 2017-01-13 HISTORY — DX: Depression, unspecified: F32.A

## 2017-01-13 HISTORY — DX: Major depressive disorder, single episode, unspecified: F32.9

## 2017-01-13 LAB — RAPID URINE DRUG SCREEN, HOSP PERFORMED
AMPHETAMINES: NOT DETECTED
BARBITURATES: NOT DETECTED
BENZODIAZEPINES: NOT DETECTED
COCAINE: POSITIVE — AB
Opiates: NOT DETECTED
TETRAHYDROCANNABINOL: NOT DETECTED

## 2017-01-13 LAB — COMPREHENSIVE METABOLIC PANEL
ALK PHOS: 77 U/L (ref 38–126)
ALT: 19 U/L (ref 17–63)
ANION GAP: 8 (ref 5–15)
AST: 28 U/L (ref 15–41)
Albumin: 4 g/dL (ref 3.5–5.0)
BUN: 14 mg/dL (ref 6–20)
CALCIUM: 8.9 mg/dL (ref 8.9–10.3)
CHLORIDE: 105 mmol/L (ref 101–111)
CO2: 30 mmol/L (ref 22–32)
Creatinine, Ser: 1.12 mg/dL (ref 0.61–1.24)
GLUCOSE: 83 mg/dL (ref 65–99)
Potassium: 3.3 mmol/L — ABNORMAL LOW (ref 3.5–5.1)
SODIUM: 143 mmol/L (ref 135–145)
Total Bilirubin: 1 mg/dL (ref 0.3–1.2)
Total Protein: 7.6 g/dL (ref 6.5–8.1)

## 2017-01-13 LAB — CBC
HEMATOCRIT: 40.1 % (ref 39.0–52.0)
HEMOGLOBIN: 13.1 g/dL (ref 13.0–17.0)
MCH: 28.1 pg (ref 26.0–34.0)
MCHC: 32.7 g/dL (ref 30.0–36.0)
MCV: 86.1 fL (ref 78.0–100.0)
Platelets: 120 10*3/uL — ABNORMAL LOW (ref 150–400)
RBC: 4.66 MIL/uL (ref 4.22–5.81)
RDW: 14.5 % (ref 11.5–15.5)
WBC: 9.4 10*3/uL (ref 4.0–10.5)

## 2017-01-13 LAB — SALICYLATE LEVEL: Salicylate Lvl: <7 mg/dL (ref 2.8–30.0)

## 2017-01-13 LAB — ACETAMINOPHEN LEVEL

## 2017-01-13 LAB — ETHANOL: Alcohol, Ethyl (B): <5 mg/dL (ref <5)

## 2017-01-13 MED ORDER — SERTRALINE HCL 50 MG PO TABS
50.0000 mg | ORAL_TABLET | Freq: Every day | ORAL | Status: DC
  Administered 2017-01-13 – 2017-01-14 (×2): 50 mg via ORAL
  Filled 2017-01-13 (×2): qty 1

## 2017-01-13 MED ORDER — HYDROXYZINE HCL 25 MG PO TABS: 25.0000 mg | ORAL_TABLET | Freq: Four times a day (QID) | ORAL | Status: DC | PRN

## 2017-01-13 MED ORDER — ACETAMINOPHEN 325 MG PO TABS: 650.0000 mg | ORAL_TABLET | ORAL | Status: DC | PRN

## 2017-01-13 MED ORDER — LISINOPRIL 10 MG PO TABS
10.0000 mg | ORAL_TABLET | Freq: Every day | ORAL | Status: DC
  Administered 2017-01-13 – 2017-01-14 (×2): 10 mg via ORAL
  Filled 2017-01-13 (×2): qty 1

## 2017-01-13 MED ORDER — PANTOPRAZOLE SODIUM 40 MG PO TBEC
40.0000 mg | DELAYED_RELEASE_TABLET | Freq: Two times a day (BID) | ORAL | Status: DC
  Administered 2017-01-13 – 2017-01-14 (×3): 40 mg via ORAL
  Filled 2017-01-13 (×2): qty 1

## 2017-01-13 MED ORDER — IBUPROFEN 200 MG PO TABS: 600.0000 mg | ORAL_TABLET | Freq: Three times a day (TID) | ORAL | Status: DC | PRN

## 2017-01-13 MED ORDER — LEVOTHYROXINE SODIUM 50 MCG PO TABS
50.0000 ug | ORAL_TABLET | Freq: Every day | ORAL | Status: DC
  Administered 2017-01-13 – 2017-01-14 (×2): 50 ug via ORAL
  Filled 2017-01-13 (×2): qty 1

## 2017-01-13 MED ORDER — LORAZEPAM 1 MG PO TABS: 1.0000 mg | ORAL_TABLET | Freq: Three times a day (TID) | ORAL | Status: DC | PRN

## 2017-01-13 MED ORDER — ADULT MULTIVITAMIN W/MINERALS CH
1.0000 | ORAL_TABLET | Freq: Every day | ORAL | Status: DC
  Administered 2017-01-13 – 2017-01-14 (×2): 1 via ORAL
  Filled 2017-01-13 (×2): qty 1

## 2017-01-13 MED ORDER — ONDANSETRON HCL 4 MG PO TABS: 4.0000 mg | ORAL_TABLET | Freq: Three times a day (TID) | ORAL | Status: DC | PRN

## 2017-01-13 MED ORDER — AMLODIPINE BESYLATE 5 MG PO TABS
10.0000 mg | ORAL_TABLET | Freq: Every day | ORAL | Status: DC
  Administered 2017-01-13 – 2017-01-14 (×2): 10 mg via ORAL
  Filled 2017-01-13 (×3): qty 2

## 2017-01-13 MED ORDER — TRAZODONE HCL 50 MG PO TABS
150.0000 mg | ORAL_TABLET | Freq: Every evening | ORAL | Status: DC | PRN
  Administered 2017-01-13: 150 mg via ORAL
  Filled 2017-01-13: qty 1

## 2017-01-13 NOTE — ED Notes (Signed)
Patient admits to [facility] with no plan and denies HI and AVH a this time. Plan of care discussed with patient. Encouragement and support provided and safety maintain. Q 15 min safety checks remain in place and video monitoring.

## 2017-01-13 NOTE — ED Triage Notes (Signed)
Pt here voluntarily with the police, he states that he is suicidal with no plan because he's very depressed Pt is suppose to take zoloft for depresson but doesn't have any

## 2017-01-13 NOTE — Progress Notes (Signed)
4/[id number]:  LRT introduced self to pt and offered activities.  Pt was lying down watching television.  Pt declined.   Caroll Rancher, LRT/CTRS

## 2017-01-13 NOTE — ED Notes (Signed)
ED Provider at bedside. 

## 2017-01-13 NOTE — ED Notes (Signed)
Bed: [facility] Expected date:  Expected time:  Means of arrival:  Comments: HOLD FOR TRIAGE 3

## 2017-01-13 NOTE — ED Notes (Signed)
Patient changed into purple scrubs/yellow socks. Patient wanded by security. 

## 2017-01-13 NOTE — ED Provider Notes (Signed)
[facility]-EMERGENCY DEPT Provider Note   CSN: [id number] Arrival date & time: 01/13/17  4098     History   Chief Complaint Chief Complaint  Patient presents with  . Medical Clearance    HPI Gene Garcia is a 56 y.o. male.  HPI 56 year old male who presents with SI and increasing depression. History of MDD and substance abuse. Endorses increased stressors recently, difficulty "dealing with people" and has been feeling depressed. States it is hard to cope with feelings. Increased SI feeling, but no plan. No HI or hallucinations. Use to take zoloft but states he stopped taking this medication. He states he has been hospitalized before for inpatient psychiatry but not sure how long ago. Endorses drinking a 12 pack of beer every few days and using crack cocaine, last use yesterday. No other complaints. No recent illness.   Past Medical History:  Diagnosis Date  . Asthma   . Depression   . Hypertension   . Hypothyroidism   . Thyroid disease     Patient Active Problem List   Diagnosis Date Noted  . Pain in the chest 05/26/2016  . Thrombocytopenia (HCC) 05/26/2016  . Chest pain 05/26/2016  . Cocaine abuse   . Cocaine use disorder, severe, dependence (HCC) 10/06/2015  . 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 Substance or medication-induced anxiety disorder (HCC) 10/06/2015  . Hypokalemia 10/06/2015  . Suicidal ideation   . Unintended weight loss 04/09/2015  . HTN (hypertension) 11/21/2014  . History of stroke 11/21/2014  . History of thyroid disease 11/21/2014    History reviewed. No pertinent surgical history.     Home Medications    Prior to Admission medications   Medication Sig Start Date End Date Taking? Authorizing Provider  amLODipine (NORVASC) 10 MG tablet Take 1 tablet (10 mg total) by mouth daily. For high blood pressure 06/01/16   Belkys A Regalado, MD  aspirin EC 81 MG tablet Take 1 tablet (81 mg total)  by mouth daily. For heart health Patient not taking: Reported on 05/26/2016 10/09/15   Sanjuana Kava, NP  hydrOXYzine (ATARAX/VISTARIL) 25 MG tablet Take 1 tablet (25 mg total) by mouth every 6 (six) hours as needed for anxiety. 10/09/15   Sanjuana Kava, NP  levothyroxine (SYNTHROID, LEVOTHROID) 50 MCG tablet Take 1 tablet (50 mcg total) by mouth daily before breakfast. For Thyroid 06/01/16   Belkys A Regalado, MD  lisinopril (PRINIVIL,ZESTRIL) 10 MG tablet Take 1 tablet (10 mg total) by mouth daily. 06/01/16   Belkys A Regalado, MD  Multiple Vitamin (MULTIVITAMIN WITH MINERALS) TABS tablet Take 1 tablet by mouth daily. 06/01/16   Belkys A Regalado, MD  pantoprazole (PROTONIX) 40 MG tablet Take 1 tablet (40 mg total) by mouth 2 (two) times daily. 06/01/16   Belkys A Regalado, MD  sertraline (ZOLOFT) 50 MG tablet Take 1 tablet (50 mg total) by mouth daily. For depression 06/01/16   Belkys A Regalado, MD  traZODone (DESYREL) 150 MG tablet Take 1 tablet (150 mg total) by mouth at bedtime as needed for sleep. 06/01/16   Alba Cory, MD    Family History Family History  Problem Relation Age of Onset  . Diabetes Maternal Aunt     Social History Social History  Substance Use Topics  . Smoking status: Never Smoker  . Smokeless tobacco: Never Used  . Alcohol use No     Allergies   Patient has no known allergies.   Review  of Systems Review of Systems 10/14 systems reviewed and are negative other than those stated in the HPI   Physical Exam Updated Vital Signs BP (!) 168/95 (BP Location: Left Arm)   Pulse 68   Temp 98.6 F (37 C) (Oral)   Resp 20   SpO2 98%   Physical Exam Physical Exam  Nursing note and vitals reviewed. Constitutional: Well developed, well nourished, non-toxic, and in no acute distress Head: Normocephalic and atraumatic.  Mouth/Throat: Oropharynx is clear and moist.  Neck: Normal range of motion. Neck supple.  Cardiovascular: Normal rate and regular rhythm.     Pulmonary/Chest: Effort normal and breath sounds normal.  Abdominal: Soft. There is no tenderness. There is no rebound and no guarding.  Musculoskeletal: Normal range of motion.  Neurological: Alert, no facial droop, fluent speech, moves all extremities symmetrically Skin: Skin is warm and dry.  Psychiatric: Cooperative   ED Treatments / Results  Labs (all labs ordered are listed, but only abnormal results are displayed) Labs Reviewed  CBC - Abnormal; Notable for the following:       Result Value   Platelets 120 (*)    All other components within normal limits  COMPREHENSIVE METABOLIC PANEL  ETHANOL  SALICYLATE LEVEL  ACETAMINOPHEN LEVEL  RAPID URINE DRUG SCREEN, HOSP PERFORMED    EKG  EKG Interpretation None       Radiology No results found.  Procedures Procedures (including critical care time)  Medications Ordered in ED Medications - No data to display   Initial Impression / Assessment and Plan / ED Course  I have reviewed the triage vital signs and the nursing notes.  Pertinent labs & imaging results that were available during my care of the patient were reviewed by me and considered in my medical decision making (see chart for details).     Presents with feelings of increased depression and SI. Well appearing and in no acute distress. With normal vitals. Exam non-focal. Medical screening blood work pending, but is felt to be medically clear for TTS consult.  Final Clinical Impressions(s) / ED Diagnoses   Final diagnoses:  Moderate episode of recurrent major depressive disorder [facility])    New Prescriptions New Prescriptions   No medications on file     Lavera Guise, MD 01/13/17 [phone]

## 2017-01-13 NOTE — ED Notes (Signed)
Pt transferred from Main ED. Appears depressed. Calm and cooperative with assessment. No acute distress noted. Continues to endorse SI but has no plan and verbally contracts for safety. Endorses a history of AVH, but currently denies. Denies HI. States he just feels like he needs a safe place to organize his thoughts. Support and encouragement provided. Explained that Psychiatrist would be rounding soon. Otherwise, he offered no questions or concerns. Safety has been maintained with q15 min obs and camera obs. Will continue current POC pending disposition.

## 2017-01-14 ENCOUNTER — Encounter (HOSPITAL_COMMUNITY): Payer: Self-pay | Admitting: Emergency Medicine

## 2017-01-14 DIAGNOSIS — F1414 Cocaine abuse with cocaine-induced mood disorder: Secondary | ICD-10-CM | POA: Diagnosis present

## 2017-01-14 MED ORDER — SERTRALINE HCL 50 MG PO TABS: 50.0000 mg | ORAL_TABLET | Freq: Every day | ORAL | 0 refills | Status: AC

## 2017-01-14 MED ORDER — TRAZODONE HCL 150 MG PO TABS: 150.0000 mg | ORAL_TABLET | Freq: Every evening | ORAL | 0 refills | Status: AC | PRN

## 2017-01-14 MED ORDER — HYDROXYZINE HCL 25 MG PO TABS: 25.0000 mg | ORAL_TABLET | Freq: Four times a day (QID) | ORAL | 0 refills | Status: AC | PRN

## 2017-01-14 NOTE — ED Notes (Signed)
Pt d/c home per MD order.  Discharge summary reviewed with pt. Pt verbalizes understanding of discharge summary and resources. Pt denies SI/HI/AVH. Bus pass provided per pt request. RX provided.Pt signed for personal property and property returned. Pt signed e-signature. Ambulatory off unit with M[facility].

## 2017-01-14 NOTE — BH Assessment (Addendum)
Assessment Note  Gene Garcia is an 56 y.o. male. Patient presents to [facility] with increased suicidal thoughts, voluntary. Sts that he is homeless, lonely, and doesn't have anyone to talk to. He has no support system in . Sts that he is from [place] and has lived here for 4 yrs. Patient asked if he has a suicide plan and he sts, "I'm trying to think of one". No history of suicide attempts or gestures. No acces to means. No HI. He is calm and cooperative. He reports auditory hallucinations of voices telling him to kill himself. Denies visual hallucinations. Patient does not appear to be responding to internal stimuli. He uses cocaine and thc regularly. Last use was 01/12/2017.He seeks outpatient treatment at [facility]. He has a history of 1 prior INPT admission at [facility] 10/05/2015.  Diagnosis: Major Depressive Disorder, Recurrent, Severe with psychotic features; Cocaine Abuse; Alcohol Abuse  Past Medical History:  Past Medical History:  Diagnosis Date  . Asthma   . Depression   . Hypertension   . Hypothyroidism   . Thyroid disease     History reviewed. No pertinent surgical history.  Family History:  Family History  Problem Relation Age of Onset  . Diabetes Maternal Aunt     Social History:  reports that he has never smoked. He has never used smokeless tobacco. He reports that he uses drugs, including "Crack" cocaine. He reports that he does not drink alcohol.  Additional Social History:  Alcohol / Drug Use Pain Medications: none reported Prescriptions: SEE MAR; non compliant with Zoloft. Sts he stopped taking it a few weeks ago but doesn't know why. Over the Counter: none reported History of alcohol / drug use?: Yes Substance #1 Name of Substance 1: alcohol  1 - Age of First Use: 56 yrs old 1 - Amount (size/oz): 6pk to 12pk  1 - Frequency: daily  1 - Duration: on-going  1 - Last Use / Amount: 01/12/2017 Substance #2 Name of Substance 2: cocaine  2 - Age of First Use: 56 yrs old  2 - Amount  (size/oz): "alot" 2 - Frequency: daily  2 - Duration: on-going  2 - Last Use / Amount: 01/12/2017  CIWA: CIWA-Ar BP: 118/74 Pulse Rate: 80 COWS:    Allergies: No Known Allergies  Home Medications:  (Not in a hospital admission)  OB/GYN Status:  No LMP for male patient.  General Assessment Data Location of Assessment: [facility] ED TTS Assessment: In system Is this a Tele or Face-to-Face Assessment?: Face-to-Face Is this an Initial Assessment or a Re-assessment for this encounter?: Initial Assessment Marital status: Single Maiden name:  (n/a) Is patient pregnant?: No Pregnancy Status: No Living Arrangements: Other (Comment) Can pt return to current living arrangement?: Yes Admission Status: Voluntary Is patient capable of signing voluntary admission?: Yes Referral Source: Other Insurance type:  (self pay )     Crisis Care Plan Living Arrangements: Other (Comment) Legal Guardian: Other: (no legal guardian ) Name of Psychiatrist:  Museum/gallery curator ) Name of Therapist:  (no therapist)  Education Status Is patient currently in school?: No Current Grade:  (n/a) Highest grade of school patient has completed:  (unk) Name of school:  (n/a) Contact person:  (n/a)  Risk to self with the past 6 months Suicidal Ideation: Yes-Currently Present Has patient been a risk to self within the past 6 months prior to admission? : Yes Suicidal Intent: Yes-Currently Present Has patient had any suicidal intent within the past 6 months prior to admission? : Yes Is  patient at risk for suicide?: Yes Suicidal Plan?: No ("I'm trying to think of a plan") Has patient had any suicidal plan within the past 6 months prior to admission? : No Access to Means: No What has been your use of drugs/alcohol within the last 12 months?:  (cocaine and alcohol ) Previous Attempts/Gestures: No How many times?:  (0) Other Self Harm Risks:  (no self harm ) Triggers for Past Attempts: Other (Comment) (no prior attempts or  gestures) Intentional Self Injurious Behavior: None Family Suicide History: No Recent stressful life event(s): Other (Comment) (lonely, "no one to talk to", homeless) Persecutory voices/beliefs?: No Depression: Yes Depression Symptoms: Feeling angry/irritable, Loss of interest in usual pleasures, Fatigue, Tearfulness, Isolating Substance abuse history and/or treatment for substance abuse?: No Suicide prevention information given to non-admitted patients: Not applicable  Risk to Others within the past 6 months Homicidal Ideation: No Does patient have any lifetime risk of violence toward others beyond the six months prior to admission? : No Thoughts of Harm to Others: No Current Homicidal Intent: No Current Homicidal Plan: No Access to Homicidal Means: No Identified Victim:  (n/a) History of harm to others?: No Assessment of Violence: None Noted Violent Behavior Description:  (patient is calm and cooperative ) Does patient have access to weapons?: No Criminal Charges Pending?: No Does patient have a court date: No Is patient on probation?: No  Psychosis Hallucinations: Auditory ("Go ahead and kill yourself") Delusions: None noted  Mental Status Report Appearance/Hygiene: Disheveled Eye Contact: Good Motor Activity: Freedom of movement Speech: Logical/coherent Level of Consciousness: Alert Mood: Depressed Affect: Appropriate to circumstance Anxiety Level: None Thought Processes: Relevant Judgement: Impaired Orientation: Person, Place, Situation, Time Obsessive Compulsive Thoughts/Behaviors: None  Cognitive Functioning Concentration: Decreased Memory: Recent Intact, Remote Intact IQ: Average Insight: Good Impulse Control: Fair Appetite: Fair Weight Loss:  (none reported) Weight Gain:  (none reported) Sleep: Decreased Total Hours of Sleep:  ("I don't know") Vegetative Symptoms: None  ADLScreening [facility]) Patient's cognitive ability adequate to  safely complete daily activities?: Yes Patient able to express need for assistance with ADLs?: Yes Independently performs ADLs?: Yes (appropriate for developmental age)     Prior Outpatient Therapy Prior Outpatient Therapy: Yes Prior Therapy Dates:  (current ) Prior Therapy Facilty/Provider(s):  Museum/gallery curator ) Reason for Treatment:  (medication management ) Does patient have an ACCT team?: No Does patient have Intensive In-House Services?  : No Does patient have [facility] services? : No Does patient have P4[facility] services?: No  ADL Screening (condition at time of admission) Patient's cognitive ability adequate to safely complete daily activities?: Yes Is the patient deaf or have difficulty hearing?: No Does the patient have difficulty seeing, even when wearing glasses/contacts?: No Does the patient have difficulty concentrating, remembering, or making decisions?: No Patient able to express need for assistance with ADLs?: Yes Does the patient have difficulty dressing or bathing?: No Independently performs ADLs?: Yes (appropriate for developmental age) Does the patient have difficulty walking or climbing stairs?: No Weakness of Legs: None Weakness of Arms/Hands: None  Home Assistive Devices/Equipment Home Assistive Devices/Equipment: None    Abuse/Neglect Assessment (Assessment to be complete while patient is alone) Physical Abuse: Denies Verbal Abuse: Denies Sexual Abuse: Denies Exploitation of patient/patient's resources: Denies Self-Neglect: Denies     Merchant navy officer (For Healthcare) Does Patient Have a Medical Advance Directive?: No Would patient like information on creating a medical advance directive?: No - Patient declined Nutrition Screen- [facility] Adult/[facility] Patient's home diet: Regular  Additional Information  1:1 In Past 12 Months?: No CIRT Risk: No Elopement Risk: No Does patient have medical clearance?: Yes     Disposition: Pending am psych evaluation.   Disposition Initial Assessment Completed for this Encounter: Yes  On Site Evaluation by:   Reviewed with Physician:    Melynda Ripple 01/14/2017 9:00 AM

## 2017-01-14 NOTE — BH Assessment (Signed)
[facility] Assessment Progress Note  Per Thedore Mins, MD, this pt does not require psychiatric hospitalization at this time.  Pt is to be discharged from [facility] with referral information for [facility] and for [facility].  This has been included in pt's discharge instructions.  Pt's nurse, Morrie Sheldon, has been notified.  Doylene Canning, MA Triage Specialist [phone]

## 2017-01-14 NOTE — [facility] Suicide Risk Assessment (Signed)
Suicide Risk Assessment  Discharge Assessment   [facility] Discharge Suicide Risk Assessment   Principal Problem: Cocaine abuse with cocaine-induced mood disorder [facility]) Discharge Diagnoses:  Patient Active Problem List   Diagnosis Date Noted  . Cocaine abuse with cocaine-induced mood disorder (HCC) [F14.14] 01/14/2017    Priority: High  . Pain in the chest [R07.9] 05/26/2016  . Thrombocytopenia (HCC) [D69.6] 05/26/2016  . Chest pain [R07.9] 05/26/2016  . Cocaine abuse [F14.10]   . Cocaine use disorder, severe, dependence (HCC) [F14.20] 10/06/2015  . 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 Substance or medication-induced anxiety disorder (HCC) [F19.980] 10/06/2015  . Hypokalemia [E87.6] 10/06/2015  . Suicidal ideation [R45.851]   . Unintended weight loss [R63.4] 04/09/2015  . HTN (hypertension) [I10] 11/21/2014  . History of stroke [Z86.73] 11/21/2014  . History of thyroid disease [Z86.39] 11/21/2014    Total Time spent with patient: 45 minutes  Musculoskeletal: Strength & Muscle Tone: within normal limits Gait & Station: normal Patient leans: N/A  Psychiatric Specialty Exam:   Blood pressure 118/74, pulse 80, temperature 98.3 F (36.8 C), resp. rate 17, SpO2 99 %.There is no height or weight on file to calculate BMI.  General Appearance: Casual  Eye Contact::  Good  Speech:  Normal Rate409  Volume:  Normal  Mood:  Depressed, mild  Affect:  Congruent  Thought Process:  Coherent and Descriptions of Associations: Intact  Orientation:  Full (Time, Place, and Person)  Thought Content:  WDL and Logical  Suicidal Thoughts:  No  Homicidal Thoughts:  No  Memory:  Immediate;   Good Recent;   Good Remote;   Good  Judgement:  Fair  Insight:  Fair  Psychomotor Activity:  Normal  Concentration:  Good  Recall:  Good  Fund of Knowledge:Fair  Language: Good  Akathisia:  No  Handed:  Right  AIMS (if indicated):      Assets:  Leisure Time Physical Health Resilience  Sleep:     Cognition: WNL  ADL's:  Intact   Mental Status Per Nursing Assessment::   On Admission:   cocaine abuse with suicidal ideations.  Yesterday, he responded to suicidal ideations with a "Not really", kept over night and medications started.  Today, he denies suicidal/homicidal ideations, hallucinations, and withdrawal symptoms.  Stable for discharge, patient of [facility].  Recently in rehab, stressor is being homeless, shelter resources in place.  Demographic Factors:  Male  Loss Factors: NA  Historical Factors: NA  Risk Reduction Factors:   Sense of responsibility to family and Positive therapeutic relationship  Continued Clinical Symptoms:  Depression, mild  Cognitive Features That Contribute To Risk:  None    Suicide Risk:  Minimal: No identifiable suicidal ideation.  Patients presenting with no risk factors but with morbid ruminations; may be classified as minimal risk based on the severity of the depressive symptoms    Plan Of Care/Follow-up recommendations:  Activity:  as tolerated Diet:  heart healthy diet  LORD, JAMISON, NP 01/14/2017, 9:20 AM

## 2017-01-14 NOTE — Discharge Instructions (Signed)
For your ongoing mental health needs, you are advised to follow up with [facility].  New and returning patients are seen at their walk-in clinic.  Walk-in hours are Monday - Friday from 8:00 am - 3:00 pm.  Walk-in patients are seen on a first come, first served basis.  Try to arrive as early as possible for he best chance of being seen the same day: ° °     [facility] °     [address] °     [place], [place] [address] °     [phone] ° °To help you maintain a sober lifestyle, a substance abuse treatment program may be beneficial to you.  Contact [facility] at your earliest opportunity to ask about enrolling in their program: ° °     [facility] °     [address] °     [place], [place] [address] °     [phone] °     New patients are seen at the walk-in clinic every Tuesday from 9:00 am - 12:00 pm. °

## 2017-01-14 NOTE — ED Notes (Signed)
Pt taking a shower 

## 2017-01-14 NOTE — Progress Notes (Signed)
CSW provided patient with shelter resources and bus pass. Patient [place] [address] and is familiar with resources in the area.   Stacy Gardner, LCSWA Clinical Social Worker [phone]

## 2018-07-18 ENCOUNTER — Encounter (HOSPITAL_COMMUNITY): Payer: Self-pay | Admitting: Emergency Medicine

## 2018-07-18 ENCOUNTER — Other Ambulatory Visit: Payer: Self-pay

## 2018-07-18 ENCOUNTER — Emergency Department (HOSPITAL_COMMUNITY)
Admission: EM | Admit: 2018-07-18 | Discharge: 2018-07-18 | Disposition: A | Discharge status: Home / Self Care | Payer: Medicaid Other | Attending: Emergency Medicine | Admitting: Emergency Medicine

## 2018-07-18 ENCOUNTER — Emergency Department (HOSPITAL_COMMUNITY): Payer: Medicaid Other

## 2018-07-18 DIAGNOSIS — Z5321 Procedure and treatment not carried out due to patient leaving prior to being seen by health care provider: Secondary | ICD-10-CM | POA: Insufficient documentation

## 2018-07-18 DIAGNOSIS — R079 Chest pain, unspecified: Secondary | ICD-10-CM | POA: Insufficient documentation

## 2018-07-18 LAB — CBC
HCT: 39.6 % (ref 39.0–52.0)
HEMOGLOBIN: 13 g/dL (ref 13.0–17.0)
MCH: 28.4 pg (ref 26.0–34.0)
MCHC: 32.8 g/dL (ref 30.0–36.0)
MCV: 86.5 fL (ref 78.0–100.0)
PLATELETS: 132 10*3/uL — AB (ref 150–400)
RBC: 4.58 MIL/uL (ref 4.22–5.81)
RDW: 14 % (ref 11.5–15.5)
WBC: 6.6 10*3/uL (ref 4.0–10.5)

## 2018-07-18 LAB — I-STAT TROPONIN, ED: TROPONIN I, POC: 0.01 ng/mL (ref 0.00–0.08)

## 2018-07-18 LAB — BASIC METABOLIC PANEL
ANION GAP: 12 (ref 5–15)
BUN: 18 mg/dL (ref 6–20)
CALCIUM: 9.2 mg/dL (ref 8.9–10.3)
CO2: 21 mmol/L — ABNORMAL LOW (ref 22–32)
CREATININE: 1.39 mg/dL — AB (ref 0.61–1.24)
Chloride: 104 mmol/L (ref 98–111)
GFR calc Af Amer: >60 mL/min (ref >60)
GFR, EST NON AFRICAN AMERICAN: 54 mL/min — AB (ref >60)
GLUCOSE: 100 mg/dL — AB (ref 70–99)
Potassium: 2.8 mmol/L — ABNORMAL LOW (ref 3.5–5.1)
Sodium: 137 mmol/L (ref 135–145)

## 2018-07-18 NOTE — ED Triage Notes (Signed)
Patient arrives via EMS with chest pain, started at 1900 this evening, no radiation, no shortness of breath, no nausea or vomiting.  He has had 2 nitro and 324mg  ASA.  NSR on EKG and VSS.  Pain has decreased from 6/10 and down to 4/10 after the nitro.

## 2018-07-18 NOTE — ED Notes (Signed)
Pt refuses to stay to see Dr., advised pt to stay and he would be next due to low K, pt states he does not care and he is leaving.

## 2018-07-24 ENCOUNTER — Encounter (HOSPITAL_COMMUNITY): Payer: Self-pay | Admitting: Emergency Medicine

## 2018-07-24 ENCOUNTER — Other Ambulatory Visit: Payer: Self-pay

## 2018-07-24 ENCOUNTER — Emergency Department (HOSPITAL_COMMUNITY)
Admission: EM | Admit: 2018-07-24 | Discharge: 2018-07-25 | Disposition: A | Discharge status: Other Acute Inpatient Hospital | Payer: Medicaid Other | Attending: Emergency Medicine | Admitting: Emergency Medicine

## 2018-07-24 ENCOUNTER — Emergency Department (HOSPITAL_COMMUNITY): Payer: Medicaid Other

## 2018-07-24 DIAGNOSIS — J45909 Unspecified asthma, uncomplicated: Secondary | ICD-10-CM | POA: Insufficient documentation

## 2018-07-24 DIAGNOSIS — E039 Hypothyroidism, unspecified: Secondary | ICD-10-CM | POA: Insufficient documentation

## 2018-07-24 DIAGNOSIS — F329 Major depressive disorder, single episode, unspecified: Secondary | ICD-10-CM | POA: Insufficient documentation

## 2018-07-24 DIAGNOSIS — F32A Depression, unspecified: Secondary | ICD-10-CM

## 2018-07-24 DIAGNOSIS — Z79899 Other long term (current) drug therapy: Secondary | ICD-10-CM | POA: Insufficient documentation

## 2018-07-24 DIAGNOSIS — R440 Auditory hallucinations: Secondary | ICD-10-CM

## 2018-07-24 DIAGNOSIS — F22 Delusional disorders: Secondary | ICD-10-CM

## 2018-07-24 DIAGNOSIS — R45851 Suicidal ideations: Secondary | ICD-10-CM

## 2018-07-24 DIAGNOSIS — R0789 Other chest pain: Secondary | ICD-10-CM | POA: Diagnosis not present

## 2018-07-24 DIAGNOSIS — I10 Essential (primary) hypertension: Secondary | ICD-10-CM | POA: Insufficient documentation

## 2018-07-24 DIAGNOSIS — F14959 Cocaine use, unspecified with cocaine-induced psychotic disorder, unspecified: Secondary | ICD-10-CM | POA: Diagnosis not present

## 2018-07-24 DIAGNOSIS — R079 Chest pain, unspecified: Secondary | ICD-10-CM

## 2018-07-24 DIAGNOSIS — R443 Hallucinations, unspecified: Secondary | ICD-10-CM | POA: Diagnosis present

## 2018-07-24 LAB — RAPID URINE DRUG SCREEN, HOSP PERFORMED
AMPHETAMINES: NOT DETECTED
BARBITURATES: NOT DETECTED
Benzodiazepines: NOT DETECTED
Cocaine: POSITIVE — AB
Opiates: NOT DETECTED
TETRAHYDROCANNABINOL: NOT DETECTED

## 2018-07-24 LAB — URINALYSIS, ROUTINE W REFLEX MICROSCOPIC
BILIRUBIN URINE: NEGATIVE
GLUCOSE, UA: NEGATIVE mg/dL
HGB URINE DIPSTICK: NEGATIVE
Ketones, ur: NEGATIVE mg/dL
Leukocytes, UA: NEGATIVE
Nitrite: NEGATIVE
PROTEIN: NEGATIVE mg/dL
Specific Gravity, Urine: 1.02 (ref 1.005–1.030)
pH: 5 (ref 5.0–8.0)

## 2018-07-24 LAB — I-STAT TROPONIN, ED
Troponin i, poc: 0 ng/mL (ref 0.00–0.08)
Troponin i, poc: 0.02 ng/mL (ref 0.00–0.08)

## 2018-07-24 LAB — CBC
HEMATOCRIT: 41.4 % (ref 39.0–52.0)
HEMOGLOBIN: 13.1 g/dL (ref 13.0–17.0)
MCH: 28.2 pg (ref 26.0–34.0)
MCHC: 31.6 g/dL (ref 30.0–36.0)
MCV: 89.2 fL (ref 80.0–100.0)
Platelets: 141 10*3/uL — ABNORMAL LOW (ref 150–400)
RBC: 4.64 MIL/uL (ref 4.22–5.81)
RDW: 14.5 % (ref 11.5–15.5)
WBC: 8.6 10*3/uL (ref 4.0–10.5)
nRBC: 0 % (ref 0.0–0.2)

## 2018-07-24 LAB — COMPREHENSIVE METABOLIC PANEL
ALBUMIN: 3.5 g/dL (ref 3.5–5.0)
ALT: 38 U/L (ref 0–44)
AST: 34 U/L (ref 15–41)
Alkaline Phosphatase: 84 U/L (ref 38–126)
Anion gap: 7 (ref 5–15)
BILIRUBIN TOTAL: 0.6 mg/dL (ref 0.3–1.2)
BUN: 7 mg/dL (ref 6–20)
CALCIUM: 8.9 mg/dL (ref 8.9–10.3)
CO2: 24 mmol/L (ref 22–32)
CREATININE: 1.51 mg/dL — AB (ref 0.61–1.24)
Chloride: 108 mmol/L (ref 98–111)
GFR calc Af Amer: 57 mL/min — ABNORMAL LOW (ref >60)
GFR, EST NON AFRICAN AMERICAN: 49 mL/min — AB (ref >60)
Glucose, Bld: 92 mg/dL (ref 70–99)
POTASSIUM: 3.7 mmol/L (ref 3.5–5.1)
Sodium: 139 mmol/L (ref 135–145)
Total Protein: 7 g/dL (ref 6.5–8.1)

## 2018-07-24 LAB — ETHANOL: Alcohol, Ethyl (B): <10 mg/dL (ref <10)

## 2018-07-24 LAB — LIPASE, BLOOD: Lipase: 30 U/L (ref 11–51)

## 2018-07-24 MED ORDER — GI COCKTAIL ~~LOC~~
30.0000 mL | Freq: Once | ORAL | Status: AC
  Administered 2018-07-24: 30 mL via ORAL
  Filled 2018-07-24: qty 30

## 2018-07-24 MED ORDER — ASPIRIN 81 MG PO CHEW
324.0000 mg | CHEWABLE_TABLET | Freq: Once | ORAL | Status: AC
  Administered 2018-07-24: 324 mg via ORAL
  Filled 2018-07-24: qty 4

## 2018-07-24 NOTE — ED Notes (Signed)
Lab to add-on lipase  

## 2018-07-24 NOTE — ED Notes (Signed)
Med student at bedside

## 2018-07-24 NOTE — BH Assessment (Addendum)
Tele Assessment Note   Patient Name: Gene Garcia MRN: [id number] Referring Physician: Dr. Rush Landmark Location of Patient: [facility] Bed: F08C Location of Provider: Behavioral Health TTS Department  Gene Garcia is an 57 y.o. male presenting with hallucinations and SI with plan to overdose on medication. Patient also seen for chest pain. Patient reports that for the last month he has had worsening depression and having suicidal thoughts.  He reports that he has been having some paranoid feelings and delusions that people are after him. Patient reported history of depression and that he is homeless. Patient came to ED alone. Patient stated, "I am tired of living like this, I will be 57 in December, I shouldn't be living like this". Patient reported having SI for past year. Patient admitted using crack cocaine and drinking alcohol. Patient reported hallucinations for past 2 years. Patient stated, "voices tell me, I am going to kill you, and I don't know what I did, what did I do, what did I do that was so bad". Patient stated, "I hear voices when I sleep, they keep me woke and I here them saying, they are coming to get you, so I start running at night trying to hide". Patient not receiving any outpatient resources. Patient reported having no family support system. Patient regarding 2-3 hours of sleep nightly and has a poor appetite. Patient was calm and cooperative during assessment. Patient denied HI.   UDS +cocaine. ETOH negative.  Disposition: Initial Assessment Completed for this Encounter: Yes  Gene Sievert, PA, patient meets inpatient criteria. TTS to secure placement. Doctor and Gene Garcia, Charity fundraiser, informed of disposition.  Diagnosis: Major depressive disorder, cocaine-induced psychotic disorder  Past Medical History:  Past Medical History:  Diagnosis Date  . Asthma   . Depression   . Hypertension   . Hypothyroidism   . Thyroid disease     History reviewed. No pertinent surgical history.  Family  History:  Family History  Problem Relation Age of Onset  . Diabetes Maternal Aunt     Social History:  reports that he has never smoked. He has never used smokeless tobacco. He reports that he drinks alcohol. He reports that he has current or past drug history. Drugs: "Crack" cocaine and Cocaine.  Additional Social History:  Alcohol / Drug Use Pain Medications: see MAR Prescriptions: see MAR Over the Counter: see MAR  CIWA: CIWA-Ar BP: 114/82 Pulse Rate: 78 COWS:    Allergies: No Known Allergies  Home Medications:  (Not in a hospital admission)  OB/GYN Status:  No LMP for male patient.  General Assessment Data Location of Assessment: [facility] ED TTS Assessment: In system Is this a Tele or Face-to-Face Assessment?: Tele Assessment Is this an Initial Assessment or a Re-assessment for this encounter?: Initial Assessment Patient Accompanied by:: (self) Language Other than English: No Living Arrangements: Homeless/Shelter What gender do you identify as?: Male Marital status: Single Living Arrangements: (homeless) Admission Status: Voluntary Is patient capable of signing voluntary admission?: Yes Referral Source: Self/Family/Friend     Crisis Care Plan Living Arrangements: (homeless) Legal Guardian: (self) Name of Psychiatrist: (none) Name of Therapist: (none)  Education Status Is patient currently in school?: No Is the patient employed, unemployed or receiving disability?: Receiving disability income  Risk to self with the past 6 months Suicidal Ideation: Yes-Currently Present Has patient been a risk to self within the past 6 months prior to admission? : Yes Suicidal Intent: Yes-Currently Present Has patient had any suicidal intent within the past 6 months prior to  admission? : Yes Is patient at risk for suicide?: Yes Suicidal Plan?: Yes-Currently Present Has patient had any suicidal plan within the past 6 months prior to admission? : Yes Specify Current Suicidal  Plan: (overdose on pills) Access to Means: Yes Specify Access to Suicidal Means: (has pills in belongings) What has been your use of drugs/alcohol within the last 12 months?: (alcohol and crack/cocaine) Previous Attempts/Gestures: No How many times?: (0) Triggers for Past Attempts: (drug addiction and homelessness) Intentional Self Injurious Behavior: None Family Suicide History: No Recent stressful life event(s): Financial Problems(homelessness and drug addiction) Persecutory voices/beliefs?: No Depression: Yes Depression Symptoms: Insomnia, Tearfulness, Isolating, Fatigue, Loss of interest in usual pleasures, Feeling worthless/self pity Substance abuse history and/or treatment for substance abuse?: No  Risk to Others within the past 6 months Homicidal Ideation: No Does patient have any lifetime risk of violence toward others beyond the six months prior to admission? : No Thoughts of Harm to Others: No Current Homicidal Intent: No Current Homicidal Plan: No Access to Homicidal Means: No History of harm to others?: No Assessment of Violence: None Noted Does patient have access to weapons?: No Criminal Charges Pending?: No Does patient have a court date: No Is patient on probation?: No  Psychosis Hallucinations: Auditory, Visual Delusions: None noted  Mental Status Report Appearance/Hygiene: Unremarkable Eye Contact: Fair Motor Activity: Unremarkable Speech: Slurred, Logical/coherent Level of Consciousness: Alert Mood: Depressed, Helpless, Sad, Worthless, low self-esteem, Despair Affect: Depressed, Sad Anxiety Level: Minimal Thought Processes: Coherent Judgement: Impaired Orientation: Person, Place, Time, Situation, Appropriate for developmental age Obsessive Compulsive Thoughts/Behaviors: None  Cognitive Functioning Concentration: Fair Memory: Remote Impaired, Recent Impaired Is patient IDD: No Insight: Fair Impulse Control: Poor Appetite: Poor Have you had any  weight changes? : No Change Sleep: Decreased Total Hours of Sleep: 3 Vegetative Symptoms: None  ADLScreening [facility] Assessment Services) Patient's cognitive ability adequate to safely complete daily activities?: Yes Patient able to express need for assistance with ADLs?: Yes Independently performs ADLs?: Yes (appropriate for developmental age)  Prior Inpatient Therapy Prior Inpatient Therapy: No  Prior Outpatient Therapy Prior Outpatient Therapy: No Does patient have an ACCT team?: No Does patient have Intensive In-House Services?  : No Does patient have [facility] services? : No Does patient have P4CC services?: No  ADL Screening (condition at time of admission) Patient's cognitive ability adequate to safely complete daily activities?: Yes Patient able to express need for assistance with ADLs?: Yes Independently performs ADLs?: Yes (appropriate for developmental age)                        Disposition:  Disposition Initial Assessment Completed for this Encounter: Yes  Gene Sievert, PA, patient meets inpatient criteria. TTS to secure placement. Doctor and Gene Garcia, Charity fundraiser, informed of disposition.  This service was provided via telemedicine using a 2-way, interactive audio and video technology.  Names of all persons participating in this telemedicine service and their role in this encounter. Name: Tonya Carlile Role: patient  Name: Al Corpus, [facility] Role: TTS Clinician  Name:  Role:   Name:  Role:     Burnetta Sabin 07/24/2018 7:40 PM

## 2018-07-24 NOTE — ED Notes (Signed)
Patient transported to X-ray 

## 2018-07-24 NOTE — ED Triage Notes (Signed)
Pt. Stated, I have chest pain, depression and Im just down and out,. Pt. Stated when ask if he wants to harm himself he stated yes that too.

## 2018-07-24 NOTE — ED Notes (Signed)
Pt belongings inventoried and placed in [facility] locker #4 Security was called and pt has been wanded.

## 2018-07-24 NOTE — ED Provider Notes (Signed)
[facility] EMERGENCY DEPARTMENT Provider Note   CSN: [id number] Arrival date & time: 07/24/18  1349     History   Chief Complaint Chief Complaint  Patient presents with  . Chest Pain  . Suicidal  . Depression    HPI Gene Garcia is a 57 y.o. male.  The history is provided by the patient and medical records. No language interpreter was used.  Mental Health Problem  Presenting symptoms: delusional, depression, hallucinations, paranoid behavior and suicidal thoughts   Presenting symptoms: no agitation, no homicidal ideas and no suicide attempt   Degree of incapacity (severity):  Moderate Onset quality:  Gradual Duration:  1 month Timing:  Constant Progression:  Worsening Chronicity:  New Context: drug abuse   Treatment compliance:  Untreated Associated symptoms: chest pain   Associated symptoms: no abdominal pain, no fatigue and no headaches   Chest Pain   This is a new problem. The current episode started yesterday. The problem occurs constantly. The problem has not changed since onset.The pain is associated with exertion. The pain is present in the substernal region. The pain is at a severity of 7/10. The pain is moderate. The quality of the pain is described as exertional and sharp. The pain does not radiate. Associated symptoms include cough and malaise/fatigue. Pertinent negatives include no abdominal pain, no back pain, no diaphoresis, no dizziness, no exertional chest pressure, no fever, no headaches, no leg pain, no lower extremity edema, no nausea, no palpitations, no shortness of breath, no sputum production, no syncope, no vomiting and no weakness. He has tried nothing for the symptoms. The treatment provided no relief. Risk factors include male gender.    Past Medical History:  Diagnosis Date  . Asthma   . Depression   . Hypertension   . Hypothyroidism   . Thyroid disease     Patient Active Problem List   Diagnosis Date Noted  . Cocaine  abuse with cocaine-induced mood disorder (HCC) 01/14/2017  . Pain in the chest 05/26/2016  . Thrombocytopenia (HCC) 05/26/2016  . Chest pain 05/26/2016  . Cocaine abuse (HCC)   . Cocaine use disorder, severe, dependence (HCC) 10/06/2015  . 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 Substance or medication-induced anxiety disorder (HCC) 10/06/2015  . Hypokalemia 10/06/2015  . Suicidal ideation   . Unintended weight loss 04/09/2015  . HTN (hypertension) 11/21/2014  . History of stroke 11/21/2014  . History of thyroid disease 11/21/2014    History reviewed. No pertinent surgical history.      Home Medications    Prior to Admission medications   Medication Sig Start Date End Date Taking? Authorizing Provider  amLODipine (NORVASC) 10 MG tablet Take 1 tablet (10 mg total) by mouth daily. For high blood pressure 06/01/16   Regalado, Belkys A, MD  aspirin EC 81 MG tablet Take 1 tablet (81 mg total) by mouth daily. For heart health Patient not taking: Reported on 05/26/2016 10/09/15   Armandina Stammer I, NP  hydrOXYzine (ATARAX/VISTARIL) 25 MG tablet Take 1 tablet (25 mg total) by mouth every 6 (six) hours as needed for anxiety. 01/14/17   Charm Rings, NP  levothyroxine (SYNTHROID, LEVOTHROID) 50 MCG tablet Take 1 tablet (50 mcg total) by mouth daily before breakfast. For Thyroid 06/01/16   Regalado, Belkys A, MD  lisinopril (PRINIVIL,ZESTRIL) 10 MG tablet Take 1 tablet (10 mg total) by mouth daily. 06/01/16   Regalado, Prentiss Bells, MD  Multiple Vitamin (MULTIVITAMIN WITH  MINERALS) TABS tablet Take 1 tablet by mouth daily. 06/01/16   Regalado, Belkys A, MD  pantoprazole (PROTONIX) 40 MG tablet Take 1 tablet (40 mg total) by mouth 2 (two) times daily. 06/01/16   Regalado, Belkys A, MD  sertraline (ZOLOFT) 50 MG tablet Take 1 tablet (50 mg total) by mouth daily. For depression 01/14/17   Charm Rings, NP  traZODone (DESYREL) 150 MG tablet Take 1  tablet (150 mg total) by mouth at bedtime as needed for sleep. 01/14/17   Charm Rings, NP    Family History Family History  Problem Relation Age of Onset  . Diabetes Maternal Aunt     Social History Social History   Tobacco Use  . Smoking status: Never Smoker  . Smokeless tobacco: Never Used  Substance Use Topics  . Alcohol use: Yes  . Drug use: Yes    Types: "Crack" cocaine, Cocaine    Comment: pt states no drugs in 1 year     Allergies   Patient has no known allergies.   Review of Systems Review of Systems  Constitutional: Positive for chills and malaise/fatigue. Negative for diaphoresis, fatigue and fever.  HENT: Positive for congestion.   Eyes: Negative for visual disturbance.  Respiratory: Positive for cough. Negative for sputum production, chest tightness, shortness of breath, wheezing and stridor.   Cardiovascular: Positive for chest pain. Negative for palpitations, leg swelling and syncope.  Gastrointestinal: Negative for abdominal pain, constipation, diarrhea, nausea and vomiting.  Genitourinary: Negative for flank pain and frequency.  Musculoskeletal: Negative for back pain, neck pain and neck stiffness.  Skin: Negative for rash and wound.  Neurological: Negative for dizziness, weakness, light-headedness and headaches.  Psychiatric/Behavioral: Positive for hallucinations, paranoia and suicidal ideas. Negative for agitation, confusion and homicidal ideas.  All other systems reviewed and are negative.    Physical Exam Updated Vital Signs BP 114/82 (BP Location: Right Arm)   Pulse 78   Temp 97.9 F (36.6 C) (Oral)   Resp 16   Ht 5\' 9"  (1.753 m)   Wt 104.3 kg   SpO2 98%   BMI 33.97 kg/m   Physical Exam  Constitutional: He is oriented to person, place, and time. He appears well-developed and well-nourished.  Non-toxic appearance. He does not appear ill. No distress.  HENT:  Head: Normocephalic and atraumatic.  Nose: Nose normal.  Mouth/Throat:  Oropharynx is clear and moist. No oropharyngeal exudate.  Eyes: Pupils are equal, round, and reactive to light. Conjunctivae are normal.  Neck: Neck supple.  Cardiovascular: Normal rate, regular rhythm and intact distal pulses.  No murmur heard. Pulmonary/Chest: Effort normal and breath sounds normal. No respiratory distress. He has no wheezes. He has no rales. He exhibits no tenderness.  Abdominal: Soft. He exhibits no distension. There is no tenderness.  Musculoskeletal: He exhibits no edema or tenderness.  Neurological: He is alert and oriented to person, place, and time. No sensory deficit. He exhibits normal muscle tone.  Skin: Skin is warm and dry. Capillary refill takes less than 2 seconds. He is not diaphoretic. No erythema. No pallor.  Psychiatric: He has a normal mood and affect.  Nursing note and vitals reviewed.    ED Treatments / Results  Labs (all labs ordered are listed, but only abnormal results are displayed) Labs Reviewed  COMPREHENSIVE METABOLIC PANEL - Abnormal; Notable for the following components:      Result Value   Creatinine, Ser 1.51 (*)    GFR calc non Af Amer 49 (*)  GFR calc Af Amer 57 (*)    All other components within normal limits  CBC - Abnormal; Notable for the following components:   Platelets 141 (*)    All other components within normal limits  RAPID URINE DRUG SCREEN, HOSP PERFORMED - Abnormal; Notable for the following components:   Cocaine POSITIVE (*)    All other components within normal limits  URINE CULTURE  ETHANOL  URINALYSIS, ROUTINE W REFLEX MICROSCOPIC  LIPASE, BLOOD  I-STAT TROPONIN, ED  I-STAT TROPONIN, ED    EKG EKG Interpretation  Date/Time:  Sunday July 24 2018 13:53:29 EDT Ventricular Rate:  80 PR Interval:  150 QRS Duration: 76 QT Interval:  376 QTC Calculation: 433 R Axis:   32 Text Interpretation:  Normal sinus rhythm Septal infarct , age undetermined Abnormal ECG when comapred to prior, no significant  chagnes.  No STEMI Confirmed by Theda Belfast ([id number]) on 07/24/2018 3:04:49 PM   Radiology Dg Chest 2 View  Result Date: 07/24/2018 CLINICAL DATA:  Chest pain for 2 days EXAM: CHEST - 2 VIEW COMPARISON:  07/18/2018 FINDINGS: The heart size and mediastinal contours are within normal limits. Both lungs are hyperinflated. The visualized skeletal structures are unremarkable. IMPRESSION: COPD without acute abnormality. Electronically Signed   By: Alcide Clever M.D.   On: 07/24/2018 15:49    Procedures Procedures (including critical care time)  Medications Ordered in ED Medications  gi cocktail (Maalox,Lidocaine,Donnatal) (30 mLs Oral Given 07/24/18 1612)  aspirin chewable tablet 324 mg (324 mg Oral Given 07/24/18 1612)     Initial Impression / Assessment and Plan / ED Course  I have reviewed the triage vital signs and the nursing notes.  Pertinent labs & imaging results that were available during my care of the patient were reviewed by me and considered in my medical decision making (see chart for details).     Rawson Minix is a 57 y.o. male with a past medical history significant for polysubstance abuse including cocaine, hypertension, stroke, thyroid disease, asthma, and depression who presents with chest pain and suicidal ideation.  Patient reports that for the last month he has had worsening depression and having suicidal thoughts.  He reports that he has been having some paranoid feelings and delusions that people are after him.  He does report that he has heard some voices telling him they are going to hurt him and has had suicidal ideations.  He does not have a concrete plan he would hurt himself but he reports this is a new problem for him.  He denies any suicidal ideation.  He reports that he uses cocaine frequently last use was several days ago.  He says that he is been having chest pain on and off for the last week including starting last night that has been more persistent.  He  reports he gets up to a 7 out of 10 in severity.  He reports no associated nausea, vomiting, diaphoresis.  He does report occasional lightheadedness.  He reports he has had other symptoms including recent subjective chills, nonproductive cough, rhinorrhea and congestion.  He thinks he recently had a cold.  He reports some urinary hesitancy and frequency but denies dysuria.  He reports diarrhea over the last few days.  He denies any other medication use and has not taken any medication to help with symptoms.  Of note, he does report that his chest discomfort is exertional.  On exam, chest is nontender.  Lungs are clear.  No murmur was appreciated.  Abdomen was nontender.  Patient had symmetric pulses in upper and lower extremities.  No focal neurologic deficit initially seen.  Patient resting comfortably in exam room.  Vital signs were reassuring on arrival.  Next  EKG showed no STEMI.  Heart score calculated as a 3.  I am primarily concerned that the patient's chest discomfort is related to his recent cocaine use as well as reflux as he reports the discomfort feels similar to prior GERD pain.  Patient will have work-up including troponins chest x-ray labs.  Anticipate patient will be medically cleared so that he is stable for TTS evaluation for his auditory hallucinations, paranoia, delusions, and suicidal ideation.  Next para patient was given a GI cocktail and aspirin initially.  7:13 PM After the GI cocktail patient reports his chest pain has significantly improved.  Patient is able to tolerate eating and drinking.    Patient second troponin was normal.  Given negative troponin x2 and his heart score 3, patient was felt to have a low risk for major adverse cardiac event and would be safe for medical clearance at this time.  Suspect cocaine chest pain versus pain related to GERD.  Other laboratory testing was reassuring and chest x-ray is reassuring.    Patient is now medically cleared for TTS and  psychiatric evaluation for the auditory hallucinations, paranoid delusions, suicidal thoughts, and depression.  He is currently voluntary.  Final Clinical Impressions(s) / ED Diagnoses   Final diagnoses:  Suicidal ideation  Depression, unspecified depression type  Paranoid delusion (HCC)  Auditory hallucination  Chest pain, unspecified type     Clinical Impression: 1. Suicidal ideation   2. Depression, unspecified depression type   3. Paranoid delusion (HCC)   4. Auditory hallucination   5. Chest pain, unspecified type     Disposition: Awaiting TTS evaluation for depression and SI.  Patient is currently voluntary as he has no plan to kill himself.  This note was prepared with assistance of Conservation officer, historic buildings. Occasional wrong-word or sound-a-like substitutions may have occurred due to the inherent limitations of voice recognition software.     Russell Quinney, Canary Brim, MD 07/25/18 [phone]

## 2018-07-24 NOTE — ED Notes (Signed)
Disposition: Initial Assessment Completed for this Encounter: Yes  Donell Sievert, PA, patient meets inpatient criteria. TTS to secure placement. Doctor and Lorin Picket, Charity fundraiser, informed of disposition.

## 2018-07-25 LAB — URINE CULTURE: Culture: <10000 — AB

## 2018-07-25 NOTE — ED Notes (Signed)
Pt awaiting transport to [facility] in [place]. Belongings bags and security envelope sent with [employer]

## 2018-07-25 NOTE — ED Notes (Signed)
[facility] called for transport, will be available in 45-50 minutes.

## 2018-07-25 NOTE — BH Assessment (Signed)
[facility] Assessment Progress Note    Patient was seen by TTS for re-assessment.  Patient states that he continues to have suicidal thoughts with plan to overdose on pills.  He states that he  Is unable to contract for safety outside of the hospital.  Patient states that he continues to experience hallucinations.  He states that he did not sleep well last night.  He states that he only slept two hours.  Patient states that his appetite is good.  Due to patient's inability to contract for safety, TTS/Social Work will continue to seek inpatient placement for patient.

## 2018-07-25 NOTE — ED Notes (Signed)
Attempted to call report to [facility].

## 2018-07-25 NOTE — ED Notes (Signed)
Regular diet lunch tray ordered for pt

## 2018-07-25 NOTE — Progress Notes (Addendum)
Pt. meets criteria for inpatient treatment per [facility] simon, PA.  Referred out to the following hospitals: [facility]  [facility]  [facility]  [facility]  [facility]  [facility]  [facility]     [facility]  [facility]        Disposition CSW will continue to follow for placement.  Timmothy Euler. Kaylyn Lim, MSW, LCSWA Disposition Clinical Social Work [phone] (cell) [phone] (office)

## 2018-07-25 NOTE — Progress Notes (Addendum)
Pt accepted to [facility]  Lorenso Courier, MD. is the accepting/attending provider.  Call report to [phone]  [facility] ED notified.   Pt is Voluntary at time of referral Pt may be transported by [facility]  Pt scheduled to arrive at [facility] as soon as transport can be arranged.  Timmothy Euler. Kaylyn Lim, MSW, LCSWA Disposition Clinical Social Work [phone] (cell) [phone] (office)  Addendum: CSW contacted [facility] Admission Coordinator @[facility] at 14:47 at the request of [facility] Psych ED Nurse, Higinio Plan.  [facility] staff will call ED as soon as bed is available. They had a discharge delayed.  CSW notified [facility] Psych ED RN.

## 2018-09-14 ENCOUNTER — Encounter (HOSPITAL_COMMUNITY): Payer: Self-pay | Admitting: Emergency Medicine

## 2018-09-14 ENCOUNTER — Emergency Department (HOSPITAL_COMMUNITY)
Admission: EM | Admit: 2018-09-14 | Discharge: 2018-09-14 | Disposition: A | Discharge status: Home / Self Care | Payer: Medicaid Other | Attending: Emergency Medicine | Admitting: Emergency Medicine

## 2018-09-14 DIAGNOSIS — J209 Acute bronchitis, unspecified: Secondary | ICD-10-CM | POA: Diagnosis not present

## 2018-09-14 DIAGNOSIS — Z7982 Long term (current) use of aspirin: Secondary | ICD-10-CM | POA: Diagnosis not present

## 2018-09-14 DIAGNOSIS — R05 Cough: Secondary | ICD-10-CM | POA: Diagnosis present

## 2018-09-14 DIAGNOSIS — Z79899 Other long term (current) drug therapy: Secondary | ICD-10-CM | POA: Insufficient documentation

## 2018-09-14 DIAGNOSIS — J45909 Unspecified asthma, uncomplicated: Secondary | ICD-10-CM | POA: Insufficient documentation

## 2018-09-14 DIAGNOSIS — E039 Hypothyroidism, unspecified: Secondary | ICD-10-CM | POA: Diagnosis not present

## 2018-09-14 DIAGNOSIS — I10 Essential (primary) hypertension: Secondary | ICD-10-CM | POA: Diagnosis not present

## 2018-09-14 MED ORDER — DOXYCYCLINE HYCLATE 100 MG PO CAPS: 100.0000 mg | ORAL_CAPSULE | Freq: Two times a day (BID) | ORAL | 0 refills | Status: AC

## 2018-09-14 MED ORDER — ALBUTEROL SULFATE HFA 108 (90 BASE) MCG/ACT IN AERS
2.0000 | INHALATION_SPRAY | RESPIRATORY_TRACT | Status: DC
  Administered 2018-09-14: 2 via RESPIRATORY_TRACT
  Filled 2018-09-14: qty 6.7

## 2018-09-14 NOTE — ED Provider Notes (Signed)
[facility] Provider Note   CSN: [id number] Arrival date & time: 09/14/18  0751     History   Chief Complaint Chief Complaint  Patient presents with  . Cough    HPI Gene Garcia is a 57 y.o. male.  HPI Patient is a 57 year old male who presents to the emergency department with ongoing productive cough since last week.  Intermittent headache.  Persistent coughing.  Chest pain only when coughing.  Chills without documented fever at home.  No significant exertional shortness of breath.  Denies orthopnea.  No unilateral leg swelling.  Cough is productive.  Does not smoke cigarettes.  No history of asthma or COPD.  Symptoms are moderate in severity.   Past Medical History:  Diagnosis Date  . Asthma   . Depression   . Hypertension   . Hypothyroidism   . Thyroid disease     Patient Active Problem List   Diagnosis Date Noted  . Cocaine abuse with cocaine-induced mood disorder (HCC) 01/14/2017  . Pain in the chest 05/26/2016  . Thrombocytopenia (HCC) 05/26/2016  . Chest pain 05/26/2016  . Cocaine abuse (HCC)   . Cocaine use disorder, severe, dependence (HCC) 10/06/2015  . 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 Substance or medication-induced anxiety disorder (HCC) 10/06/2015  . Hypokalemia 10/06/2015  . Suicidal ideation   . Unintended weight loss 04/09/2015  . HTN (hypertension) 11/21/2014  . History of stroke 11/21/2014  . History of thyroid disease 11/21/2014    History reviewed. No pertinent surgical history.      Home Medications    Prior to Admission medications   Medication Sig Start Date End Date Taking? Authorizing Provider  amLODipine (NORVASC) 10 MG tablet Take 1 tablet (10 mg total) by mouth daily. For high blood pressure 06/01/16   Regalado, Belkys A, MD  aspirin EC 81 MG tablet Take 1 tablet (81 mg total) by mouth daily. For heart health Patient not  taking: Reported on 05/26/2016 10/09/15   Armandina StammerNwoko, Agnes I, NP  doxycycline (VIBRAMYCIN) 100 MG capsule Take 1 capsule (100 mg total) by mouth 2 (two) times daily. 09/14/18   Azalia Bilisampos, Amaar Oshita, MD  hydrOXYzine (ATARAX/VISTARIL) 25 MG tablet Take 1 tablet (25 mg total) by mouth every 6 (six) hours as needed for anxiety. 01/14/17   Charm RingsLord, Jamison Y, NP  levothyroxine (SYNTHROID, LEVOTHROID) 50 MCG tablet Take 1 tablet (50 mcg total) by mouth daily before breakfast. For Thyroid 06/01/16   Regalado, Belkys A, MD  lisinopril (PRINIVIL,ZESTRIL) 10 MG tablet Take 1 tablet (10 mg total) by mouth daily. 06/01/16   Regalado, Belkys A, MD  Multiple Vitamin (MULTIVITAMIN WITH MINERALS) TABS tablet Take 1 tablet by mouth daily. Patient not taking: Reported on 07/24/2018 06/01/16   Regalado, Jon BillingsBelkys A, MD  pantoprazole (PROTONIX) 40 MG tablet Take 1 tablet (40 mg total) by mouth 2 (two) times daily. 06/01/16   Regalado, Belkys A, MD  sertraline (ZOLOFT) 50 MG tablet Take 1 tablet (50 mg total) by mouth daily. For depression 01/14/17   Charm RingsLord, Jamison Y, NP  traZODone (DESYREL) 150 MG tablet Take 1 tablet (150 mg total) by mouth at bedtime as needed for sleep. 01/14/17   Charm RingsLord, Jamison Y, NP    Family History Family History  Problem Relation Age of Onset  . Diabetes Maternal Aunt     Social History Social History   Tobacco Use  . Smoking status: Never Smoker  . Smokeless tobacco: Never  Used  Substance Use Topics  . Alcohol use: Yes  . Drug use: Yes    Types: "Crack" cocaine, Cocaine    Comment: pt states no drugs in 1 year     Allergies   Patient has no known allergies.   Review of Systems Review of Systems  All other systems reviewed and are negative.    Physical Exam Updated Vital Signs BP 137/82   Pulse 95   Temp 98 F (36.7 C)   Resp 20   SpO2 98%   Physical Exam  Constitutional: He is oriented to person, place, and time. He appears well-developed and well-nourished.  HENT:  Head: Normocephalic  and atraumatic.  Eyes: EOM are normal.  Neck: Normal range of motion.  Cardiovascular: Normal rate and regular rhythm.  Pulmonary/Chest: Effort normal and breath sounds normal. No stridor. No respiratory distress. He has no wheezes. He has no rales.  Abdominal: Soft. He exhibits no distension. There is no tenderness.  Musculoskeletal: Normal range of motion.  Neurological: He is alert and oriented to person, place, and time.  Skin: Skin is warm and dry.  Psychiatric: He has a normal mood and affect. Judgment normal.  Nursing note and vitals reviewed.    ED Treatments / Results  Labs (all labs ordered are listed, but only abnormal results are displayed) Labs Reviewed - No data to display  EKG None  Radiology No results found.  Procedures Procedures (including critical care time)  Medications Ordered in ED Medications  albuterol (PROVENTIL HFA;VENTOLIN HFA) 108 (90 Base) MCG/ACT inhaler 2 puff (2 puffs Inhalation Given 09/14/18 0805)     Initial Impression / Assessment and Plan / ED Course  I have reviewed the triage vital signs and the nursing notes.  Pertinent labs & imaging results that were available during my care of the patient were reviewed by me and considered in my medical decision making (see chart for details).     Acute bronchitis.  Vital signs stable.  Albuterol to help with cough.  Home with Doxy for persistent bronchitis.  May be developing early pneumonia.  No indication for imaging.  Breath sounds clear bilaterally  Final Clinical Impressions(s) / ED Diagnoses   Final diagnoses:  Acute bronchitis, unspecified organism    ED Discharge Orders         Ordered    doxycycline (VIBRAMYCIN) 100 MG capsule  2 times daily     09/14/18 1610           Azalia Bilis, MD 09/14/18 [phone]

## 2018-09-14 NOTE — ED Triage Notes (Signed)
Pt reports productive cough since last week, also endorses occasional HA with coughing. Taking otc cough medications. Unsure about fevers at home.

## 2018-09-14 NOTE — ED Notes (Signed)
Pt verbalized dc instructions, vss, ambulatory upon discharge with nad

## 2019-07-18 IMAGING — CR DG CHEST 2V
2 series · 2 of 2 positions shown · non-contrast
Comparison: 05/26/2016.

CLINICAL DATA: Chest pain.

EXAM:
CHEST - 2 VIEW

[chest pa]
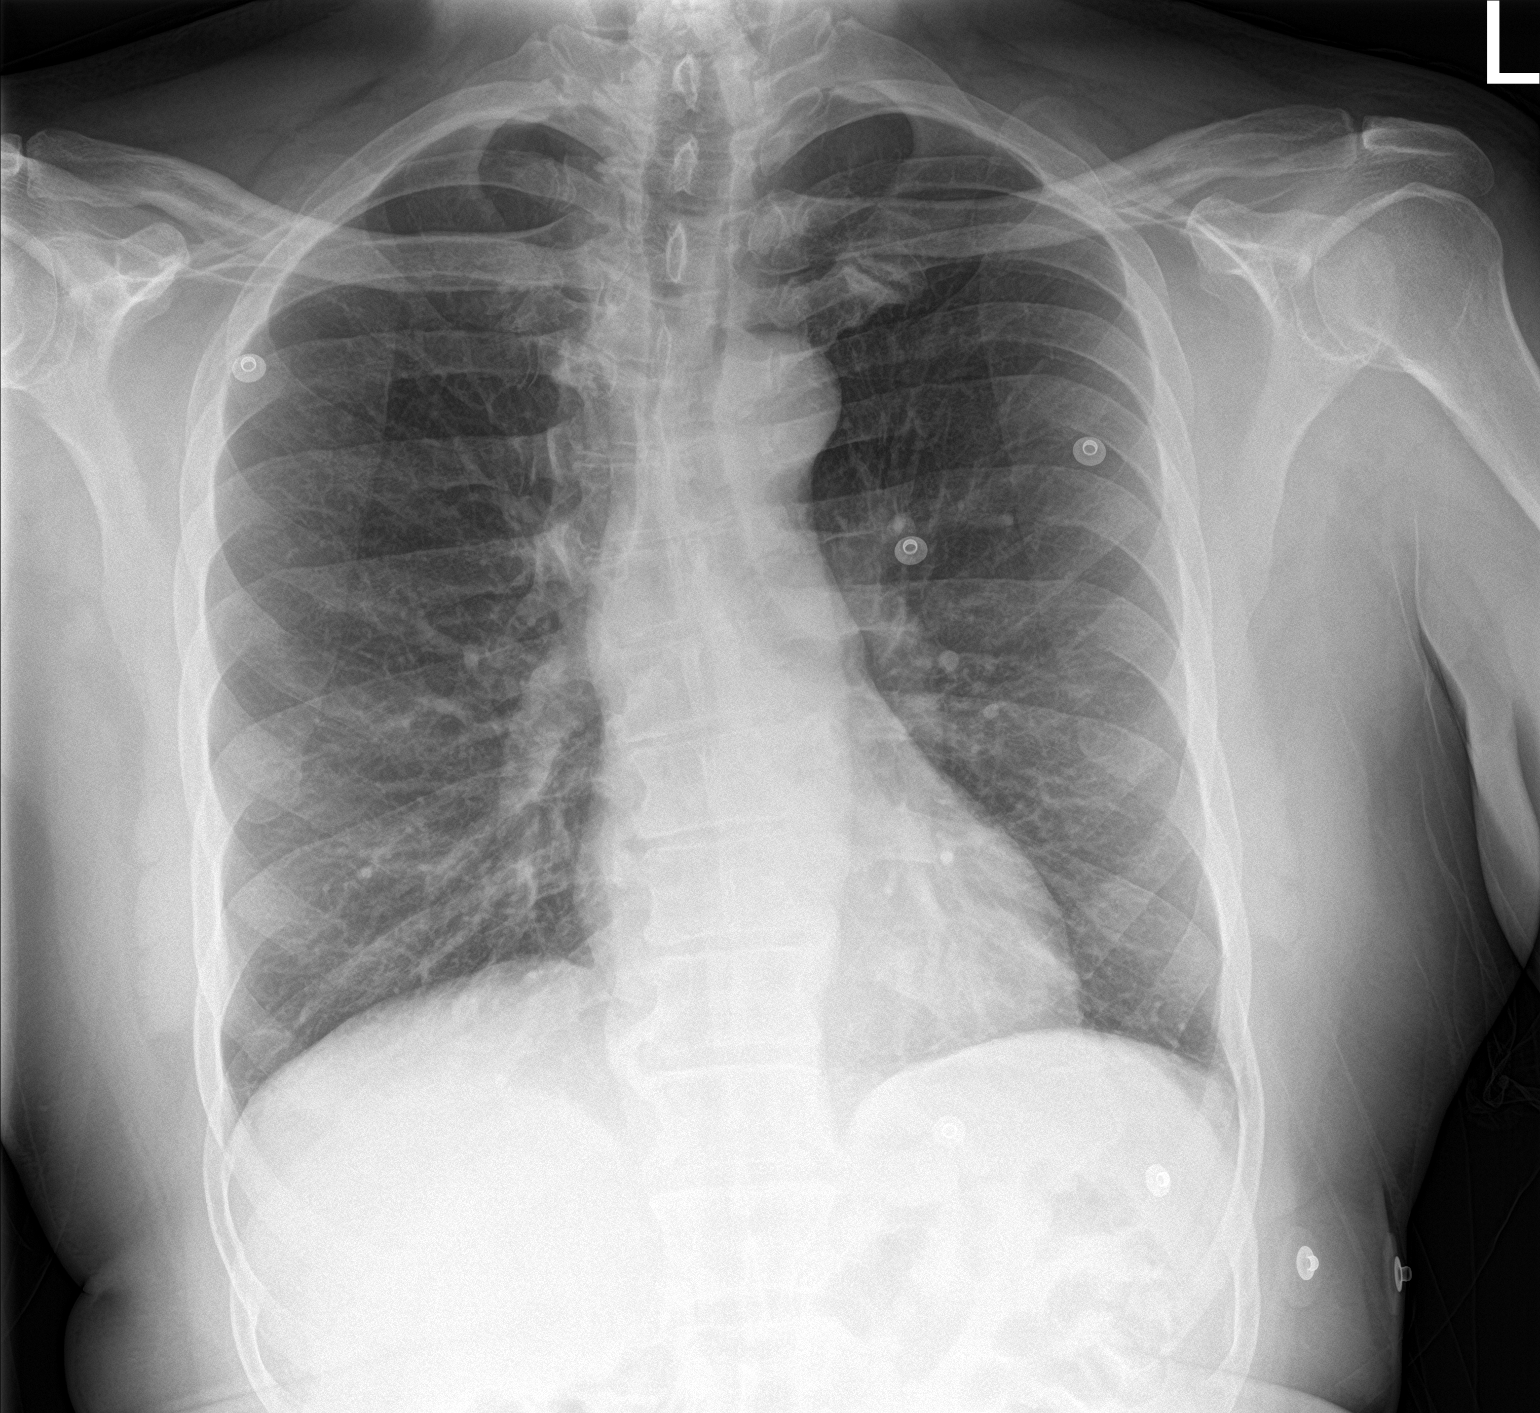

[chest lat]
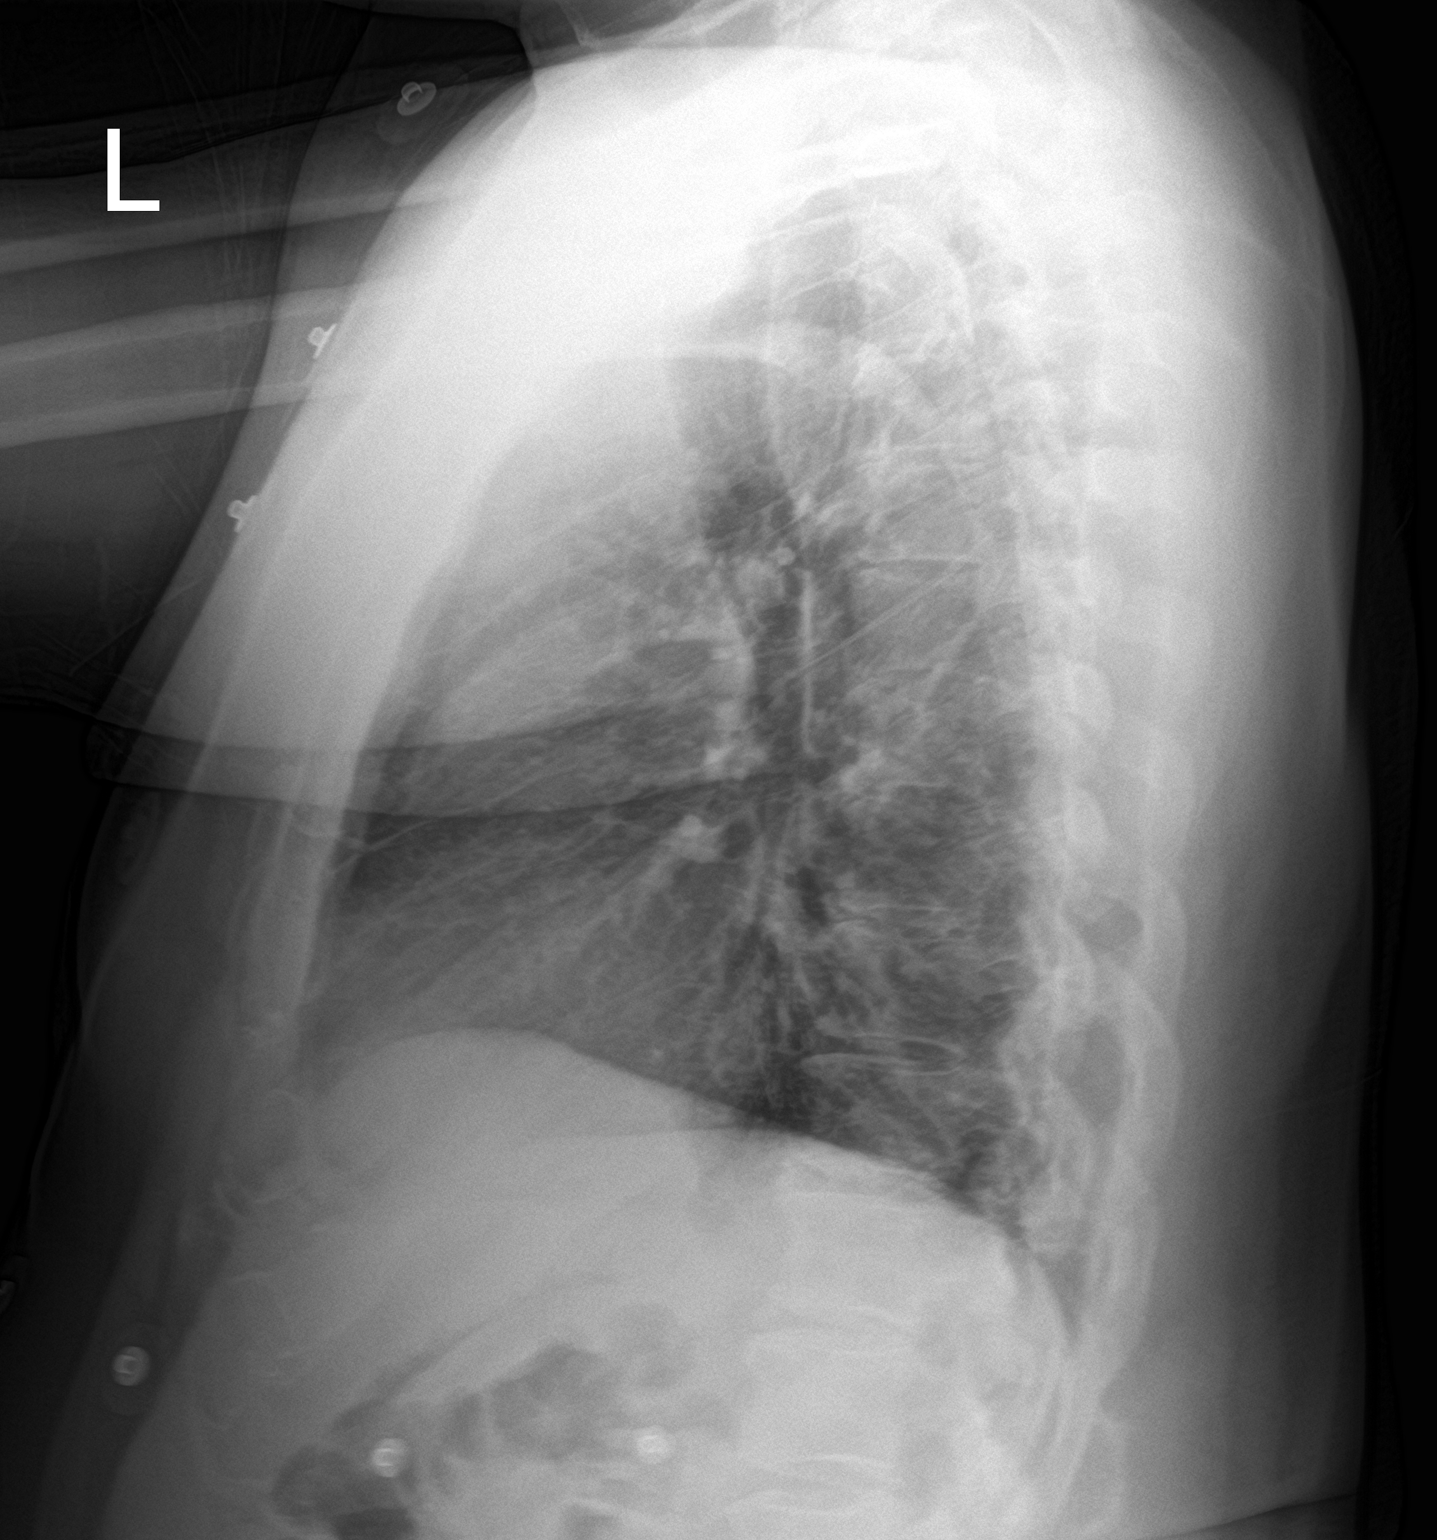

[2 of 2 positions shown; findings below may reference images not displayed]

FINDINGS: Normal sized heart. Tortuous aorta. Clear lungs. Breathing motion
blurring on the lateral view. Mild thoracic spine degenerative
changes and mild scoliosis.
IMPRESSION: No acute abnormality.
